# Patient Record
Sex: Female | Born: 1960 | Race: White | Hispanic: No | Marital: Single | Smoking: Never smoker
Health system: Southern US, Community
[De-identification: ages and names within clinical notes are randomized; demographics above are authoritative.]

## PROBLEM LIST (undated history)

## (undated) DIAGNOSIS — J45909 Unspecified asthma, uncomplicated: Secondary | ICD-10-CM

## (undated) HISTORY — PX: OTHER SURGICAL HISTORY: SHX169

---

## 2011-10-09 DIAGNOSIS — H43399 Other vitreous opacities, unspecified eye: Secondary | ICD-10-CM | POA: Diagnosis not present

## 2011-10-09 DIAGNOSIS — H524 Presbyopia: Secondary | ICD-10-CM | POA: Diagnosis not present

## 2011-10-09 DIAGNOSIS — H3589 Other specified retinal disorders: Secondary | ICD-10-CM | POA: Diagnosis not present

## 2011-10-09 DIAGNOSIS — H35039 Hypertensive retinopathy, unspecified eye: Secondary | ICD-10-CM | POA: Diagnosis not present

## 2011-11-01 DIAGNOSIS — R05 Cough: Secondary | ICD-10-CM | POA: Diagnosis not present

## 2011-11-01 DIAGNOSIS — J45901 Unspecified asthma with (acute) exacerbation: Secondary | ICD-10-CM | POA: Diagnosis not present

## 2012-05-12 DIAGNOSIS — J45902 Unspecified asthma with status asthmaticus: Secondary | ICD-10-CM | POA: Diagnosis not present

## 2012-06-21 DIAGNOSIS — Z23 Encounter for immunization: Secondary | ICD-10-CM | POA: Diagnosis not present

## 2012-10-12 DIAGNOSIS — H35039 Hypertensive retinopathy, unspecified eye: Secondary | ICD-10-CM | POA: Diagnosis not present

## 2012-10-12 DIAGNOSIS — H338 Other retinal detachments: Secondary | ICD-10-CM | POA: Diagnosis not present

## 2012-10-12 DIAGNOSIS — H2510 Age-related nuclear cataract, unspecified eye: Secondary | ICD-10-CM | POA: Diagnosis not present

## 2012-10-12 DIAGNOSIS — H5200 Hypermetropia, unspecified eye: Secondary | ICD-10-CM | POA: Diagnosis not present

## 2013-02-21 DIAGNOSIS — L02619 Cutaneous abscess of unspecified foot: Secondary | ICD-10-CM | POA: Diagnosis not present

## 2013-02-21 DIAGNOSIS — M79609 Pain in unspecified limb: Secondary | ICD-10-CM | POA: Diagnosis not present

## 2013-02-28 DIAGNOSIS — M79609 Pain in unspecified limb: Secondary | ICD-10-CM | POA: Diagnosis not present

## 2013-02-28 DIAGNOSIS — L02619 Cutaneous abscess of unspecified foot: Secondary | ICD-10-CM | POA: Diagnosis not present

## 2013-03-03 DIAGNOSIS — L500 Allergic urticaria: Secondary | ICD-10-CM | POA: Diagnosis not present

## 2013-03-03 DIAGNOSIS — T50995A Adverse effect of other drugs, medicaments and biological substances, initial encounter: Secondary | ICD-10-CM | POA: Diagnosis not present

## 2013-07-21 DIAGNOSIS — J40 Bronchitis, not specified as acute or chronic: Secondary | ICD-10-CM | POA: Diagnosis not present

## 2014-02-03 DIAGNOSIS — J45901 Unspecified asthma with (acute) exacerbation: Secondary | ICD-10-CM | POA: Diagnosis not present

## 2014-06-22 DIAGNOSIS — J4522 Mild intermittent asthma with status asthmaticus: Secondary | ICD-10-CM | POA: Diagnosis not present

## 2014-07-07 DIAGNOSIS — Z23 Encounter for immunization: Secondary | ICD-10-CM | POA: Diagnosis not present

## 2014-12-14 DIAGNOSIS — E785 Hyperlipidemia, unspecified: Secondary | ICD-10-CM | POA: Diagnosis not present

## 2014-12-14 DIAGNOSIS — J4522 Mild intermittent asthma with status asthmaticus: Secondary | ICD-10-CM | POA: Diagnosis not present

## 2014-12-14 DIAGNOSIS — R5383 Other fatigue: Secondary | ICD-10-CM | POA: Diagnosis not present

## 2014-12-21 DIAGNOSIS — Z0001 Encounter for general adult medical examination with abnormal findings: Secondary | ICD-10-CM | POA: Diagnosis not present

## 2014-12-21 DIAGNOSIS — J4522 Mild intermittent asthma with status asthmaticus: Secondary | ICD-10-CM | POA: Diagnosis not present

## 2014-12-21 DIAGNOSIS — F819 Developmental disorder of scholastic skills, unspecified: Secondary | ICD-10-CM | POA: Diagnosis not present

## 2015-04-20 DIAGNOSIS — R1031 Right lower quadrant pain: Secondary | ICD-10-CM | POA: Diagnosis not present

## 2015-04-20 DIAGNOSIS — J45909 Unspecified asthma, uncomplicated: Secondary | ICD-10-CM | POA: Diagnosis not present

## 2015-04-20 DIAGNOSIS — E279 Disorder of adrenal gland, unspecified: Secondary | ICD-10-CM | POA: Diagnosis not present

## 2015-04-20 DIAGNOSIS — Z79899 Other long term (current) drug therapy: Secondary | ICD-10-CM | POA: Diagnosis not present

## 2015-04-20 DIAGNOSIS — R0602 Shortness of breath: Secondary | ICD-10-CM | POA: Diagnosis not present

## 2015-04-20 DIAGNOSIS — R1110 Vomiting, unspecified: Secondary | ICD-10-CM | POA: Diagnosis not present

## 2015-04-20 DIAGNOSIS — Z7951 Long term (current) use of inhaled steroids: Secondary | ICD-10-CM | POA: Diagnosis not present

## 2015-04-20 DIAGNOSIS — N2889 Other specified disorders of kidney and ureter: Secondary | ICD-10-CM | POA: Diagnosis not present

## 2015-04-20 DIAGNOSIS — N1330 Unspecified hydronephrosis: Secondary | ICD-10-CM | POA: Diagnosis not present

## 2015-04-20 DIAGNOSIS — R05 Cough: Secondary | ICD-10-CM | POA: Diagnosis not present

## 2015-04-20 DIAGNOSIS — N3289 Other specified disorders of bladder: Secondary | ICD-10-CM | POA: Diagnosis not present

## 2015-04-20 DIAGNOSIS — N281 Cyst of kidney, acquired: Secondary | ICD-10-CM | POA: Diagnosis not present

## 2015-04-25 DIAGNOSIS — R1909 Other intra-abdominal and pelvic swelling, mass and lump: Secondary | ICD-10-CM | POA: Diagnosis not present

## 2015-04-25 DIAGNOSIS — N289 Disorder of kidney and ureter, unspecified: Secondary | ICD-10-CM | POA: Diagnosis not present

## 2015-04-30 DIAGNOSIS — N323 Diverticulum of bladder: Secondary | ICD-10-CM | POA: Diagnosis not present

## 2015-04-30 DIAGNOSIS — E278 Other specified disorders of adrenal gland: Secondary | ICD-10-CM | POA: Diagnosis not present

## 2015-05-20 DIAGNOSIS — J45909 Unspecified asthma, uncomplicated: Secondary | ICD-10-CM | POA: Diagnosis not present

## 2015-05-20 DIAGNOSIS — R112 Nausea with vomiting, unspecified: Secondary | ICD-10-CM | POA: Diagnosis not present

## 2015-05-20 DIAGNOSIS — Z79899 Other long term (current) drug therapy: Secondary | ICD-10-CM | POA: Diagnosis not present

## 2015-05-20 DIAGNOSIS — Z7951 Long term (current) use of inhaled steroids: Secondary | ICD-10-CM | POA: Diagnosis not present

## 2015-05-20 DIAGNOSIS — R197 Diarrhea, unspecified: Secondary | ICD-10-CM | POA: Diagnosis not present

## 2015-05-20 DIAGNOSIS — F819 Developmental disorder of scholastic skills, unspecified: Secondary | ICD-10-CM | POA: Diagnosis not present

## 2015-05-22 DIAGNOSIS — K529 Noninfective gastroenteritis and colitis, unspecified: Secondary | ICD-10-CM | POA: Diagnosis not present

## 2015-05-22 DIAGNOSIS — Q619 Cystic kidney disease, unspecified: Secondary | ICD-10-CM | POA: Diagnosis not present

## 2015-05-29 DIAGNOSIS — H52223 Regular astigmatism, bilateral: Secondary | ICD-10-CM | POA: Diagnosis not present

## 2015-05-29 DIAGNOSIS — H2513 Age-related nuclear cataract, bilateral: Secondary | ICD-10-CM | POA: Diagnosis not present

## 2015-05-29 DIAGNOSIS — H5203 Hypermetropia, bilateral: Secondary | ICD-10-CM | POA: Diagnosis not present

## 2015-05-29 DIAGNOSIS — H59811 Chorioretinal scars after surgery for detachment, right eye: Secondary | ICD-10-CM | POA: Diagnosis not present

## 2015-06-21 DIAGNOSIS — R112 Nausea with vomiting, unspecified: Secondary | ICD-10-CM | POA: Diagnosis not present

## 2015-06-21 DIAGNOSIS — Q619 Cystic kidney disease, unspecified: Secondary | ICD-10-CM | POA: Diagnosis not present

## 2015-06-21 DIAGNOSIS — Z23 Encounter for immunization: Secondary | ICD-10-CM | POA: Diagnosis not present

## 2015-07-17 DIAGNOSIS — F819 Developmental disorder of scholastic skills, unspecified: Secondary | ICD-10-CM | POA: Diagnosis not present

## 2015-07-17 DIAGNOSIS — Q619 Cystic kidney disease, unspecified: Secondary | ICD-10-CM | POA: Diagnosis not present

## 2015-07-17 DIAGNOSIS — J4522 Mild intermittent asthma with status asthmaticus: Secondary | ICD-10-CM | POA: Diagnosis not present

## 2015-09-21 DIAGNOSIS — M79671 Pain in right foot: Secondary | ICD-10-CM | POA: Diagnosis not present

## 2015-10-22 DIAGNOSIS — D7389 Other diseases of spleen: Secondary | ICD-10-CM | POA: Diagnosis not present

## 2015-10-22 DIAGNOSIS — E278 Other specified disorders of adrenal gland: Secondary | ICD-10-CM | POA: Diagnosis not present

## 2015-10-29 DIAGNOSIS — R1900 Intra-abdominal and pelvic swelling, mass and lump, unspecified site: Secondary | ICD-10-CM | POA: Diagnosis not present

## 2015-10-29 DIAGNOSIS — E278 Other specified disorders of adrenal gland: Secondary | ICD-10-CM | POA: Diagnosis not present

## 2015-11-27 DIAGNOSIS — R1900 Intra-abdominal and pelvic swelling, mass and lump, unspecified site: Secondary | ICD-10-CM | POA: Diagnosis not present

## 2015-12-04 DIAGNOSIS — N7011 Chronic salpingitis: Secondary | ICD-10-CM | POA: Diagnosis not present

## 2015-12-04 DIAGNOSIS — R1900 Intra-abdominal and pelvic swelling, mass and lump, unspecified site: Secondary | ICD-10-CM | POA: Diagnosis not present

## 2015-12-04 DIAGNOSIS — N83201 Unspecified ovarian cyst, right side: Secondary | ICD-10-CM | POA: Diagnosis not present

## 2015-12-29 DIAGNOSIS — J4522 Mild intermittent asthma with status asthmaticus: Secondary | ICD-10-CM | POA: Diagnosis not present

## 2015-12-29 DIAGNOSIS — R0602 Shortness of breath: Secondary | ICD-10-CM | POA: Diagnosis not present

## 2015-12-29 DIAGNOSIS — R05 Cough: Secondary | ICD-10-CM | POA: Diagnosis not present

## 2016-01-08 DIAGNOSIS — Z7951 Long term (current) use of inhaled steroids: Secondary | ICD-10-CM | POA: Diagnosis not present

## 2016-01-08 DIAGNOSIS — Z801 Family history of malignant neoplasm of trachea, bronchus and lung: Secondary | ICD-10-CM | POA: Diagnosis not present

## 2016-01-08 DIAGNOSIS — R1909 Other intra-abdominal and pelvic swelling, mass and lump: Secondary | ICD-10-CM | POA: Diagnosis not present

## 2016-01-08 DIAGNOSIS — Z8249 Family history of ischemic heart disease and other diseases of the circulatory system: Secondary | ICD-10-CM | POA: Diagnosis not present

## 2016-01-08 DIAGNOSIS — J45909 Unspecified asthma, uncomplicated: Secondary | ICD-10-CM | POA: Diagnosis not present

## 2016-01-08 DIAGNOSIS — N83511 Torsion of right ovary and ovarian pedicle: Secondary | ICD-10-CM | POA: Diagnosis not present

## 2016-01-08 DIAGNOSIS — N83201 Unspecified ovarian cyst, right side: Secondary | ICD-10-CM | POA: Diagnosis not present

## 2016-01-08 DIAGNOSIS — F70 Mild intellectual disabilities: Secondary | ICD-10-CM | POA: Diagnosis not present

## 2016-01-08 DIAGNOSIS — Z812 Family history of tobacco abuse and dependence: Secondary | ICD-10-CM | POA: Diagnosis not present

## 2016-01-08 DIAGNOSIS — Z818 Family history of other mental and behavioral disorders: Secondary | ICD-10-CM | POA: Diagnosis not present

## 2016-01-08 DIAGNOSIS — Z79899 Other long term (current) drug therapy: Secondary | ICD-10-CM | POA: Diagnosis not present

## 2016-01-09 DIAGNOSIS — Z7951 Long term (current) use of inhaled steroids: Secondary | ICD-10-CM | POA: Diagnosis not present

## 2016-01-09 DIAGNOSIS — Z79899 Other long term (current) drug therapy: Secondary | ICD-10-CM | POA: Diagnosis not present

## 2016-01-09 DIAGNOSIS — F70 Mild intellectual disabilities: Secondary | ICD-10-CM | POA: Diagnosis not present

## 2016-01-09 DIAGNOSIS — N838 Other noninflammatory disorders of ovary, fallopian tube and broad ligament: Secondary | ICD-10-CM | POA: Diagnosis not present

## 2016-01-09 DIAGNOSIS — R1909 Other intra-abdominal and pelvic swelling, mass and lump: Secondary | ICD-10-CM | POA: Diagnosis not present

## 2016-01-09 DIAGNOSIS — J45909 Unspecified asthma, uncomplicated: Secondary | ICD-10-CM | POA: Diagnosis not present

## 2016-01-09 DIAGNOSIS — Z8249 Family history of ischemic heart disease and other diseases of the circulatory system: Secondary | ICD-10-CM | POA: Diagnosis not present

## 2016-01-09 DIAGNOSIS — F419 Anxiety disorder, unspecified: Secondary | ICD-10-CM | POA: Diagnosis not present

## 2016-01-09 DIAGNOSIS — Z818 Family history of other mental and behavioral disorders: Secondary | ICD-10-CM | POA: Diagnosis not present

## 2016-01-09 DIAGNOSIS — Z812 Family history of tobacco abuse and dependence: Secondary | ICD-10-CM | POA: Diagnosis not present

## 2016-01-09 DIAGNOSIS — N83201 Unspecified ovarian cyst, right side: Secondary | ICD-10-CM | POA: Diagnosis not present

## 2016-01-09 DIAGNOSIS — Z801 Family history of malignant neoplasm of trachea, bronchus and lung: Secondary | ICD-10-CM | POA: Diagnosis not present

## 2016-01-09 DIAGNOSIS — N83511 Torsion of right ovary and ovarian pedicle: Secondary | ICD-10-CM | POA: Diagnosis not present

## 2016-03-26 DIAGNOSIS — M7551 Bursitis of right shoulder: Secondary | ICD-10-CM | POA: Diagnosis not present

## 2016-03-26 DIAGNOSIS — Z6825 Body mass index (BMI) 25.0-25.9, adult: Secondary | ICD-10-CM | POA: Diagnosis not present

## 2016-04-17 DIAGNOSIS — Z6824 Body mass index (BMI) 24.0-24.9, adult: Secondary | ICD-10-CM | POA: Diagnosis not present

## 2016-04-17 DIAGNOSIS — M25511 Pain in right shoulder: Secondary | ICD-10-CM | POA: Diagnosis not present

## 2016-04-17 DIAGNOSIS — M7551 Bursitis of right shoulder: Secondary | ICD-10-CM | POA: Diagnosis not present

## 2016-07-07 DIAGNOSIS — Z6824 Body mass index (BMI) 24.0-24.9, adult: Secondary | ICD-10-CM | POA: Diagnosis not present

## 2016-07-07 DIAGNOSIS — R05 Cough: Secondary | ICD-10-CM | POA: Diagnosis not present

## 2016-07-07 DIAGNOSIS — K529 Noninfective gastroenteritis and colitis, unspecified: Secondary | ICD-10-CM | POA: Diagnosis not present

## 2016-10-03 DIAGNOSIS — R1030 Lower abdominal pain, unspecified: Secondary | ICD-10-CM | POA: Diagnosis not present

## 2016-10-03 DIAGNOSIS — K529 Noninfective gastroenteritis and colitis, unspecified: Secondary | ICD-10-CM | POA: Diagnosis not present

## 2016-10-03 DIAGNOSIS — R112 Nausea with vomiting, unspecified: Secondary | ICD-10-CM | POA: Diagnosis not present

## 2016-10-03 DIAGNOSIS — Z79899 Other long term (current) drug therapy: Secondary | ICD-10-CM | POA: Diagnosis not present

## 2016-10-03 DIAGNOSIS — J45909 Unspecified asthma, uncomplicated: Secondary | ICD-10-CM | POA: Diagnosis not present

## 2016-10-06 DIAGNOSIS — R1013 Epigastric pain: Secondary | ICD-10-CM | POA: Diagnosis not present

## 2016-10-06 DIAGNOSIS — Z6824 Body mass index (BMI) 24.0-24.9, adult: Secondary | ICD-10-CM | POA: Diagnosis not present

## 2016-10-06 DIAGNOSIS — R112 Nausea with vomiting, unspecified: Secondary | ICD-10-CM | POA: Diagnosis not present

## 2016-10-07 ENCOUNTER — Emergency Department (HOSPITAL_COMMUNITY): Payer: Medicare Other

## 2016-10-07 ENCOUNTER — Emergency Department (HOSPITAL_COMMUNITY)
Admission: EM | Admit: 2016-10-07 | Discharge: 2016-10-08 | Disposition: A | Discharge status: Home / Self Care | Payer: Medicare Other | Attending: Emergency Medicine | Admitting: Emergency Medicine

## 2016-10-07 ENCOUNTER — Encounter (HOSPITAL_COMMUNITY): Payer: Self-pay

## 2016-10-07 DIAGNOSIS — J45909 Unspecified asthma, uncomplicated: Secondary | ICD-10-CM | POA: Insufficient documentation

## 2016-10-07 DIAGNOSIS — R112 Nausea with vomiting, unspecified: Secondary | ICD-10-CM | POA: Diagnosis not present

## 2016-10-07 DIAGNOSIS — Z79899 Other long term (current) drug therapy: Secondary | ICD-10-CM | POA: Insufficient documentation

## 2016-10-07 HISTORY — DX: Unspecified asthma, uncomplicated: J45.909

## 2016-10-07 LAB — URINALYSIS, ROUTINE W REFLEX MICROSCOPIC
Bilirubin Urine: NEGATIVE
Glucose, UA: NEGATIVE mg/dL
Hgb urine dipstick: NEGATIVE
KETONES UR: 5 mg/dL — AB
Nitrite: NEGATIVE
PROTEIN: NEGATIVE mg/dL
Specific Gravity, Urine: 1.011 (ref 1.005–1.030)
WBC, UA: NONE SEEN WBC/hpf (ref 0–5)
pH: 7 (ref 5.0–8.0)

## 2016-10-07 LAB — COMPREHENSIVE METABOLIC PANEL
ALT: 10 U/L — ABNORMAL LOW (ref 14–54)
AST: 14 U/L — AB (ref 15–41)
Albumin: 4.3 g/dL (ref 3.5–5.0)
Alkaline Phosphatase: 49 U/L (ref 38–126)
Anion gap: 8 (ref 5–15)
BUN: 12 mg/dL (ref 6–20)
CHLORIDE: 106 mmol/L (ref 101–111)
CO2: 27 mmol/L (ref 22–32)
Calcium: 9.4 mg/dL (ref 8.9–10.3)
Creatinine, Ser: 0.95 mg/dL (ref 0.44–1.00)
GFR calc Af Amer: >60 mL/min (ref >60)
Glucose, Bld: 83 mg/dL (ref 65–99)
POTASSIUM: 3.4 mmol/L — AB (ref 3.5–5.1)
SODIUM: 141 mmol/L (ref 135–145)
Total Bilirubin: 1 mg/dL (ref 0.3–1.2)
Total Protein: 6.7 g/dL (ref 6.5–8.1)

## 2016-10-07 LAB — CBC
HEMATOCRIT: 43.1 % (ref 36.0–46.0)
Hemoglobin: 14.6 g/dL (ref 12.0–15.0)
MCH: 31 pg (ref 26.0–34.0)
MCHC: 33.9 g/dL (ref 30.0–36.0)
MCV: 91.5 fL (ref 78.0–100.0)
Platelets: 200 10*3/uL (ref 150–400)
RBC: 4.71 MIL/uL (ref 3.87–5.11)
RDW: 13.2 % (ref 11.5–15.5)
WBC: 5.8 10*3/uL (ref 4.0–10.5)

## 2016-10-07 LAB — LIPASE, BLOOD: LIPASE: 21 U/L (ref 11–51)

## 2016-10-07 NOTE — ED Provider Notes (Signed)
[facility] DEPT Provider Note   CSN: [id number] Arrival date & time: 10/07/16  1834   By signing my name below, I, Cynthia Schwartz, attest that this documentation has been prepared under the direction and in the presence of Cynthia Porter, MD. Electronically Signed: Hilbert Schwartz, Scribe. 10/08/16. 12:14 AM.  Time seen 23:26 PM    History   Chief Complaint Chief Complaint  Patient presents with  . Nausea  . Emesis     The history is provided by the patient and a relative. No language interpreter was used.   HPI Comments: Cynthia Schwartz is a 56 y.o. female who presents to the Emergency Department complaining of worsening episodes of emesis with associated nausea for the past few months. Per family: They state that they patient doesn't vomiting every day. They states that she may vomit roughly 2 days a week. They state that the patient became worse today. She woke up this morning and began vomiting before drinking or eating anything. Her asthma began to act up around 6pm today. She was given a nebulizer with some improvement. She was seen at [facility] ED for the same last week and was put on nausea meds. She was seen yesterday by her PCP and she was started on  Nexium at that time which has provided no significant relief after only taking it one day. Her PCP is also going to refer her to a gastroenterologist. Nothing makes the nausea  worse or better. She has epigastric abdominal pain off and on. She denies weight loss. She lives with her mother. Denise hx of tobacco abuse and alcohol abuse. She is compliant with all of her medications. She states that she is feeling better currently. She denies any nausea currently.  Patient was seen at [facility] and had some type of stable growth on her kidney that they are monitoring. She also had an ovary removed last summer.  PCP [facility]   Past Medical History:  Diagnosis Date  . Asthma     There are no  active problems to display for this patient.   Past Surgical History:  Procedure Laterality Date  . ovarian cyst removed    . spot on kidney      OB History    No data available       Home Medications    Prior to Admission medications   Medication Sig Start Date End Date Taking? Authorizing Provider  albuterol (PROVENTIL) (2.5 MG/3ML) 0.083% nebulizer solution Take 2.5 mg by nebulization every 6 (six) hours as needed for wheezing or shortness of breath.   Yes Historical Provider, MD  esomeprazole (NEXIUM) 20 MG capsule Take 20 mg by mouth daily at 12 noon.   Yes Historical Provider, MD  Fluticasone-Salmeterol (ADVAIR) 100-50 MCG/DOSE AEPB Inhale 1 puff into the lungs 2 (two) times daily.   Yes Historical Provider, MD  hyoscyamine (LEVSIN SL) 0.125 MG SL tablet Place 0.125 mg under the tongue 5 (five) times daily as needed for cramping.   Yes Historical Provider, MD  ondansetron (ZOFRAN) 4 MG tablet Take 1 tablet (4 mg total) by mouth every 8 (eight) hours as needed. 10/08/16   Cynthia Porter, MD    Family History No family history on file.  Social History Social History  Substance Use Topics  . Smoking status: Never Smoker  . Smokeless tobacco: Never Used  . Alcohol use No  lives at home Lives with mother Patient is on disability for being mental slow  Allergies   Patient has no known allergies.   Review of Systems Review of Systems  Constitutional: Negative for fever.  Respiratory: Negative for cough.   Cardiovascular: Negative for chest pain.  Gastrointestinal: Positive for nausea and vomiting.  All other systems reviewed and are negative.    Physical Exam Updated Vital Signs BP 149/76 (BP Location: Left Arm)   Pulse (!) 55   Temp 98.4 F (36.9 C) (Oral)   Resp 16   Ht 5\' 5"  (1.651 m)   Wt 144 lb (65.3 kg)   SpO2 100%   BMI 23.96 kg/m   Vital signs normal except for bradycardia    Physical Exam  Constitutional: She is oriented to person, place, and  time. She appears well-developed and well-nourished.  Non-toxic appearance. She does not appear ill. No distress.  HENT:  Head: Normocephalic and atraumatic.  Right Ear: External ear normal.  Left Ear: External ear normal.  Nose: Nose normal. No mucosal edema or rhinorrhea.  Mouth/Throat: Oropharynx is clear and moist and mucous membranes are normal. No dental abscesses or uvula swelling.  Eyes: Conjunctivae and EOM are normal. Pupils are equal, round, and reactive to light.  Neck: Normal range of motion and full passive range of motion without pain. Neck supple.  Cardiovascular: Regular rhythm and normal heart sounds.  Bradycardia present.  Exam reveals no gallop and no friction rub.   No murmur heard. Pulmonary/Chest: Effort normal and breath sounds normal. No respiratory distress. She has no wheezes. She has no rhonchi. She has no rales. She exhibits no tenderness and no crepitus.  Abdominal: Soft. Normal appearance and bowel sounds are normal. She exhibits no distension. There is no tenderness. There is no rebound and no guarding.  Musculoskeletal: Normal range of motion. She exhibits no edema or tenderness.  Moves all extremities well.   Neurological: She is alert and oriented to person, place, and time. She has normal strength. No cranial nerve deficit.  Seems mentally slow  Skin: Skin is warm, dry and intact. No rash noted. No erythema. No pallor.  Psychiatric: She has a normal mood and affect. Her speech is normal and behavior is normal. Her mood appears not anxious.  Nursing note and vitals reviewed.    ED Treatments / Results  Labs (all labs ordered are listed, but only abnormal results are displayed) Results for orders placed or performed during the hospital encounter of 10/07/16  Lipase, blood  Result Value Ref Range   Lipase 21 11 - 51 U/L  Comprehensive metabolic panel  Result Value Ref Range   Sodium 141 135 - 145 mmol/L   Potassium 3.4 (L) 3.5 - 5.1 mmol/L   Chloride  106 101 - 111 mmol/L   CO2 27 22 - 32 mmol/L   Glucose, Bld 83 65 - 99 mg/dL   BUN 12 6 - 20 mg/dL   Creatinine, Ser 0.95 0.44 - 1.00 mg/dL   Calcium 9.4 8.9 - 10.3 mg/dL   Total Protein 6.7 6.5 - 8.1 g/dL   Albumin 4.3 3.5 - 5.0 g/dL   AST 14 (L) 15 - 41 U/L   ALT 10 (L) 14 - 54 U/L   Alkaline Phosphatase 49 38 - 126 U/L   Total Bilirubin 1.0 0.3 - 1.2 mg/dL   GFR calc non Af Amer >60 >60 mL/min   GFR calc Af Amer >60 >60 mL/min   Anion gap 8 5 - 15  CBC  Result Value Ref Range   WBC 5.8 4.0 -  10.5 K/uL   RBC 4.71 3.87 - 5.11 MIL/uL   Hemoglobin 14.6 12.0 - 15.0 g/dL   HCT 43.1 36.0 - 46.0 %   MCV 91.5 78.0 - 100.0 fL   MCH 31.0 26.0 - 34.0 pg   MCHC 33.9 30.0 - 36.0 g/dL   RDW 13.2 11.5 - 15.5 %   Platelets 200 150 - 400 K/uL  Urinalysis, Routine w reflex microscopic  Result Value Ref Range   Color, Urine YELLOW YELLOW   APPearance CLEAR CLEAR   Specific Gravity, Urine 1.011 1.005 - 1.030   pH 7.0 5.0 - 8.0   Glucose, UA NEGATIVE NEGATIVE mg/dL   Hgb urine dipstick NEGATIVE NEGATIVE   Bilirubin Urine NEGATIVE NEGATIVE   Ketones, ur 5 (A) NEGATIVE mg/dL   Protein, ur NEGATIVE NEGATIVE mg/dL   Nitrite NEGATIVE NEGATIVE   Leukocytes, UA TRACE (A) NEGATIVE   RBC / HPF 0-5 0 - 5 RBC/hpf   WBC, UA NONE SEEN 0 - 5 WBC/hpf   Bacteria, UA RARE (A) NONE SEEN   Mucous PRESENT    Laboratory interpretation all normal except mild hypokalemia       Radiology Dg Chest 2 View  Result Date: 10/07/2016 CLINICAL DATA:  Difficulty breathing today, nausea, vomiting, history asthma EXAM: CHEST  2 VIEW COMPARISON:  None FINDINGS: Borderline enlargement of cardiac silhouette. Mediastinal contours and pulmonary vascularity normal. Calcified granuloma LEFT mid lung. Accentuated RIGHT middle lobe markings and poor definition of RIGHT heart border likely related to pectus deformity. No definite acute infiltrate, pleural effusion or pneumothorax. Bones demineralized. IMPRESSION: No acute  abnormalities. Electronically Signed   By: Lavonia Dana M.D.   On: 10/07/2016 19:18    Procedures Procedures (including critical care time)  Medications Ordered in ED Medications - No data to display   Initial Impression / Assessment and Plan / ED Course  DIAGNOSTIC STUDIES: Oxygen Saturation is 100% on RA, normal by my interpretation.    COORDINATION OF CARE: 11:32 PM Discussed the results of her CXR and labs, pt is agreeable to try to drink fluids.   PT was able to drink without difficulty. They were advised to f/u with her PCP about the GI referral and to stay on the nexium.   I have reviewed the triage vital signs and the nursing notes.  Pertinent labs & imaging results that were available during my care of the patient were reviewed by me and considered in my medical decision making (see chart for details).       Final Clinical Impressions(s) / ED Diagnoses   Final diagnoses:  Non-intractable vomiting with nausea, unspecified vomiting type    New Prescriptions New Prescriptions   ONDANSETRON (ZOFRAN) 4 MG TABLET    Take 1 tablet (4 mg total) by mouth every 8 (eight) hours as needed.    Plan discharge  I personally performed the services described in this documentation, which was scribed in my presence. The recorded information has been reviewed and considered.  Cynthia Porter, MD, Barbette Or, MD 10/08/16 [phone]

## 2016-10-07 NOTE — ED Triage Notes (Addendum)
Family reports that pt has a problem with N/V for a "long time". Family took her to [facility] on Friday and gave her new nausea medication(levsin) and ruled out her gall bladder. Pt went to primary care yesterday and was given zofran and nexium Also note that she had a hard time breathing today. No cough

## 2016-10-07 NOTE — ED Notes (Signed)
Pt and family made aware of the wait time. Pt and family had no complaints at this time. 

## 2016-10-08 MED ORDER — ONDANSETRON HCL 4 MG PO TABS: 4.0000 mg | ORAL_TABLET | Freq: Three times a day (TID) | ORAL | 0 refills | Status: DC | PRN

## 2016-10-08 NOTE — Discharge Instructions (Signed)
Drink plenty of fluids. Use the zofran for nausea or vomiting. Follow up with your primary care doctor to get her referral to the gastroenterologist as you have already discussed with them. Continue the nexium.

## 2016-10-10 DIAGNOSIS — N281 Cyst of kidney, acquired: Secondary | ICD-10-CM | POA: Diagnosis not present

## 2016-10-10 DIAGNOSIS — E278 Other specified disorders of adrenal gland: Secondary | ICD-10-CM | POA: Diagnosis not present

## 2016-10-14 DIAGNOSIS — N2889 Other specified disorders of kidney and ureter: Secondary | ICD-10-CM | POA: Diagnosis not present

## 2016-10-14 DIAGNOSIS — E278 Other specified disorders of adrenal gland: Secondary | ICD-10-CM | POA: Diagnosis not present

## 2016-10-15 ENCOUNTER — Encounter (INDEPENDENT_AMBULATORY_CARE_PROVIDER_SITE_OTHER): Payer: Self-pay | Admitting: Internal Medicine

## 2016-10-22 DIAGNOSIS — R1013 Epigastric pain: Secondary | ICD-10-CM | POA: Diagnosis not present

## 2016-10-22 DIAGNOSIS — Q619 Cystic kidney disease, unspecified: Secondary | ICD-10-CM | POA: Diagnosis not present

## 2016-10-22 DIAGNOSIS — K529 Noninfective gastroenteritis and colitis, unspecified: Secondary | ICD-10-CM | POA: Diagnosis not present

## 2016-10-22 DIAGNOSIS — R112 Nausea with vomiting, unspecified: Secondary | ICD-10-CM | POA: Diagnosis not present

## 2016-10-27 ENCOUNTER — Encounter (INDEPENDENT_AMBULATORY_CARE_PROVIDER_SITE_OTHER): Payer: Self-pay | Admitting: Internal Medicine

## 2016-10-27 ENCOUNTER — Ambulatory Visit (INDEPENDENT_AMBULATORY_CARE_PROVIDER_SITE_OTHER): Payer: Medicare Other | Admitting: Internal Medicine

## 2016-10-27 VITALS — BP 124/70 | HR 64 | Temp 97.4°F | Ht 65.0 in | Wt 150.2 lb

## 2016-10-27 DIAGNOSIS — R112 Nausea with vomiting, unspecified: Secondary | ICD-10-CM | POA: Diagnosis not present

## 2016-10-27 NOTE — Progress Notes (Signed)
   Subjective:    Patient ID: Cynthia Schwartz, female    DOB: Dec 30, 1960, 56 y.o.   MRN: [id number]  HPI Referred by Dr. Nadara Mustard for abdominal pain Nausea and vomiting. Symptoms x 2 months. She states she has been having nausea.  The nausea occurs about once a week.  When it occurs, she will not eat for 2 days. Since last Thursday, she has eaten "like crazy". Today she feels wonderful. No nausea or vomiting. Her appetite is good for the most part. There has been no weight loss.  She has a BM about once a day. Scheduled for an MRI at [facility] for possible kidney lesion.   Seen at [facility] in January for same and was advised she had a stomach bug.   10/03/2016 total bili 0.9, AST 10.9, ALT 9, ALP 56, Lipase 22.5, Albumin 4.23, H and H 14.2 and 42.5.  Review of Systems Past Medical History:  Diagnosis Date  . Asthma     Past Surgical History:  Procedure Laterality Date  . ovarian cyst removed    . spot on kidney      No Known Allergies  Current Outpatient Prescriptions on File Prior to Visit  Medication Sig Dispense Refill  . albuterol (PROVENTIL) (2.5 MG/3ML) 0.083% nebulizer solution Take 2.5 mg by nebulization every 6 (six) hours as needed for wheezing or shortness of breath.    . esomeprazole (NEXIUM) 20 MG capsule Take 20 mg by mouth daily at 12 noon.    . Fluticasone-Salmeterol (ADVAIR) 100-50 MCG/DOSE AEPB Inhale 1 puff into the lungs 2 (two) times daily.    . hyoscyamine (LEVSIN SL) 0.125 MG SL tablet Place 0.125 mg under the tongue 5 (five) times daily as needed for cramping.    . ondansetron (ZOFRAN) 4 MG tablet Take 1 tablet (4 mg total) by mouth every 8 (eight) hours as needed. 20 tablet 0   No current facility-administered medications on file prior to visit.        Objective:   Physical Exam Blood pressure 124/70, pulse 64, temperature 97.4 F (36.3 C), height 5\' 5"  (1.651 m), weight 150 lb 3.2 oz (68.1 kg). Alert and oriented. Skin warm and dry. Oral mucosa is moist.   .  Sclera anicteric, conjunctivae is pink. Thyroid not enlarged. No cervical lymphadenopathy. Lungs clear. Heart regular rate and rhythm.  Abdomen is soft. Bowel sounds are positive. No hepatomegaly. No abdominal masses felt. No tenderness.  No edema to lower extremities.          Assessment & Plan:  Sporadic nausea. Am going to get [place] report from [facility]. HIDA scan.  Samples of Nexium OTC 20mg  given to patient x 5 days.

## 2016-10-27 NOTE — Patient Instructions (Signed)
HIDA scan.

## 2016-10-31 ENCOUNTER — Encounter (HOSPITAL_COMMUNITY): Payer: Self-pay

## 2016-10-31 ENCOUNTER — Encounter (HOSPITAL_COMMUNITY)
Admission: RE | Admit: 2016-10-31 | Discharge: 2016-10-31 | Disposition: A | Discharge status: Home / Self Care | Payer: Medicare Other | Source: Ambulatory Visit | Attending: Internal Medicine | Admitting: Internal Medicine

## 2016-10-31 DIAGNOSIS — R1013 Epigastric pain: Secondary | ICD-10-CM | POA: Diagnosis not present

## 2016-10-31 DIAGNOSIS — R112 Nausea with vomiting, unspecified: Secondary | ICD-10-CM

## 2016-10-31 MED ORDER — TECHNETIUM TC 99M MEBROFENIN IV KIT
5.0000 | PACK | Freq: Once | INTRAVENOUS | Status: AC | PRN
  Administered 2016-10-31: 5 via INTRAVENOUS

## 2016-11-01 DIAGNOSIS — E278 Other specified disorders of adrenal gland: Secondary | ICD-10-CM | POA: Diagnosis not present

## 2016-11-01 DIAGNOSIS — N281 Cyst of kidney, acquired: Secondary | ICD-10-CM | POA: Diagnosis not present

## 2016-11-01 DIAGNOSIS — D7389 Other diseases of spleen: Secondary | ICD-10-CM | POA: Diagnosis not present

## 2016-11-04 ENCOUNTER — Encounter (INDEPENDENT_AMBULATORY_CARE_PROVIDER_SITE_OTHER): Payer: Self-pay

## 2016-11-06 ENCOUNTER — Encounter (INDEPENDENT_AMBULATORY_CARE_PROVIDER_SITE_OTHER): Payer: Self-pay

## 2016-11-06 ENCOUNTER — Telehealth (INDEPENDENT_AMBULATORY_CARE_PROVIDER_SITE_OTHER): Payer: Self-pay | Admitting: Internal Medicine

## 2016-11-06 NOTE — Telephone Encounter (Signed)
Patient's aunt, Cynthia Schwartz called to get patient's results.  [phone]

## 2016-11-07 ENCOUNTER — Encounter (INDEPENDENT_AMBULATORY_CARE_PROVIDER_SITE_OTHER): Payer: Self-pay | Admitting: Internal Medicine

## 2016-11-07 NOTE — Progress Notes (Signed)
Patient was given an appointment for 02/06/17 at 9:30am with Deberah Castle, NP.  A letter was mailed to the patient.

## 2016-11-10 NOTE — Telephone Encounter (Signed)
Results have been given to patient 

## 2016-12-09 DIAGNOSIS — G44219 Episodic tension-type headache, not intractable: Secondary | ICD-10-CM | POA: Diagnosis not present

## 2016-12-09 DIAGNOSIS — H40033 Anatomical narrow angle, bilateral: Secondary | ICD-10-CM | POA: Diagnosis not present

## 2016-12-19 DIAGNOSIS — R109 Unspecified abdominal pain: Secondary | ICD-10-CM | POA: Diagnosis not present

## 2016-12-19 DIAGNOSIS — J4522 Mild intermittent asthma with status asthmaticus: Secondary | ICD-10-CM | POA: Diagnosis not present

## 2016-12-19 DIAGNOSIS — R112 Nausea with vomiting, unspecified: Secondary | ICD-10-CM | POA: Diagnosis not present

## 2017-02-05 ENCOUNTER — Ambulatory Visit (INDEPENDENT_AMBULATORY_CARE_PROVIDER_SITE_OTHER): Payer: Medicaid Other | Admitting: Internal Medicine

## 2017-02-06 ENCOUNTER — Ambulatory Visit (INDEPENDENT_AMBULATORY_CARE_PROVIDER_SITE_OTHER): Payer: Medicaid Other | Admitting: Internal Medicine

## 2017-04-30 DIAGNOSIS — J4541 Moderate persistent asthma with (acute) exacerbation: Secondary | ICD-10-CM | POA: Diagnosis not present

## 2017-07-02 DIAGNOSIS — Z23 Encounter for immunization: Secondary | ICD-10-CM | POA: Diagnosis not present

## 2017-10-30 DIAGNOSIS — N323 Diverticulum of bladder: Secondary | ICD-10-CM | POA: Diagnosis not present

## 2017-10-30 DIAGNOSIS — E278 Other specified disorders of adrenal gland: Secondary | ICD-10-CM | POA: Diagnosis not present

## 2017-10-30 DIAGNOSIS — N281 Cyst of kidney, acquired: Secondary | ICD-10-CM | POA: Diagnosis not present

## 2017-11-30 DIAGNOSIS — J4541 Moderate persistent asthma with (acute) exacerbation: Secondary | ICD-10-CM | POA: Diagnosis not present

## 2017-12-23 IMAGING — DX DG CHEST 2V
2 series · 2 of 2 positions shown · non-contrast
Comparison: None

CLINICAL DATA: Difficulty breathing today, nausea, vomiting,
history asthma

EXAM:
CHEST  2 VIEW

[chest pa]
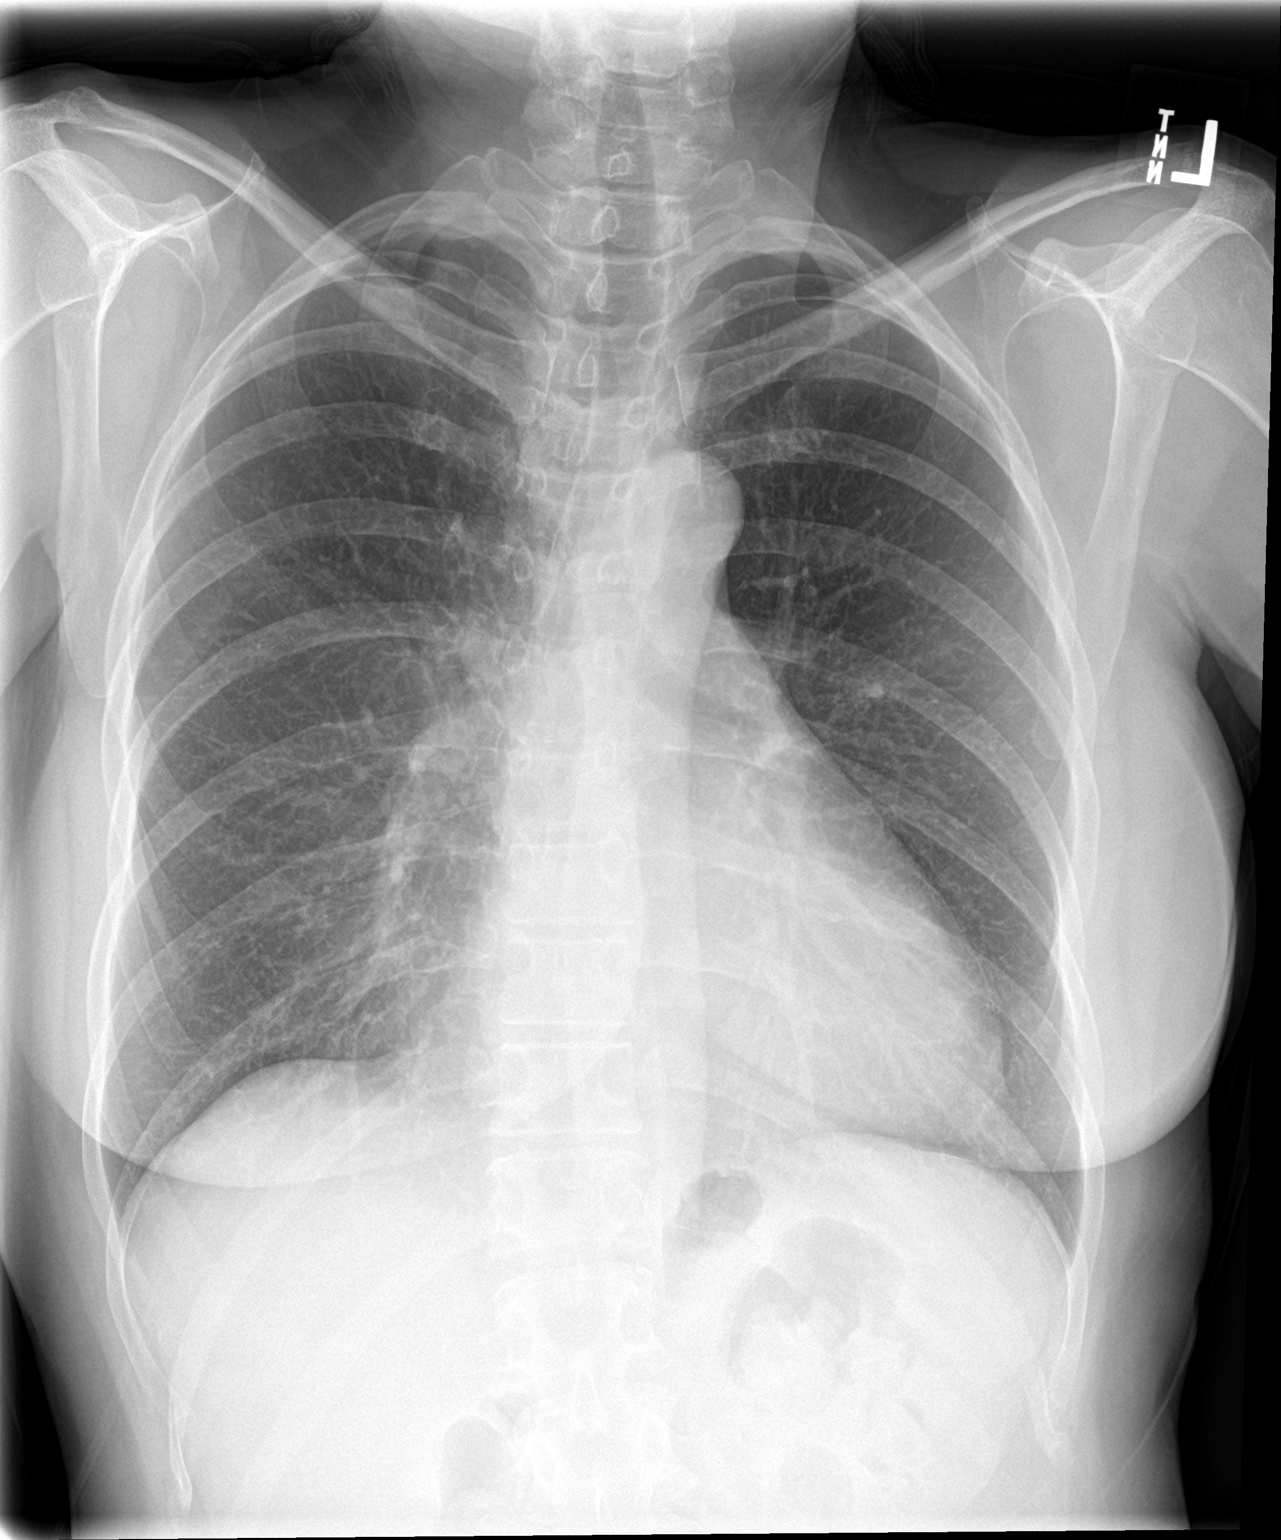

[chest lat]
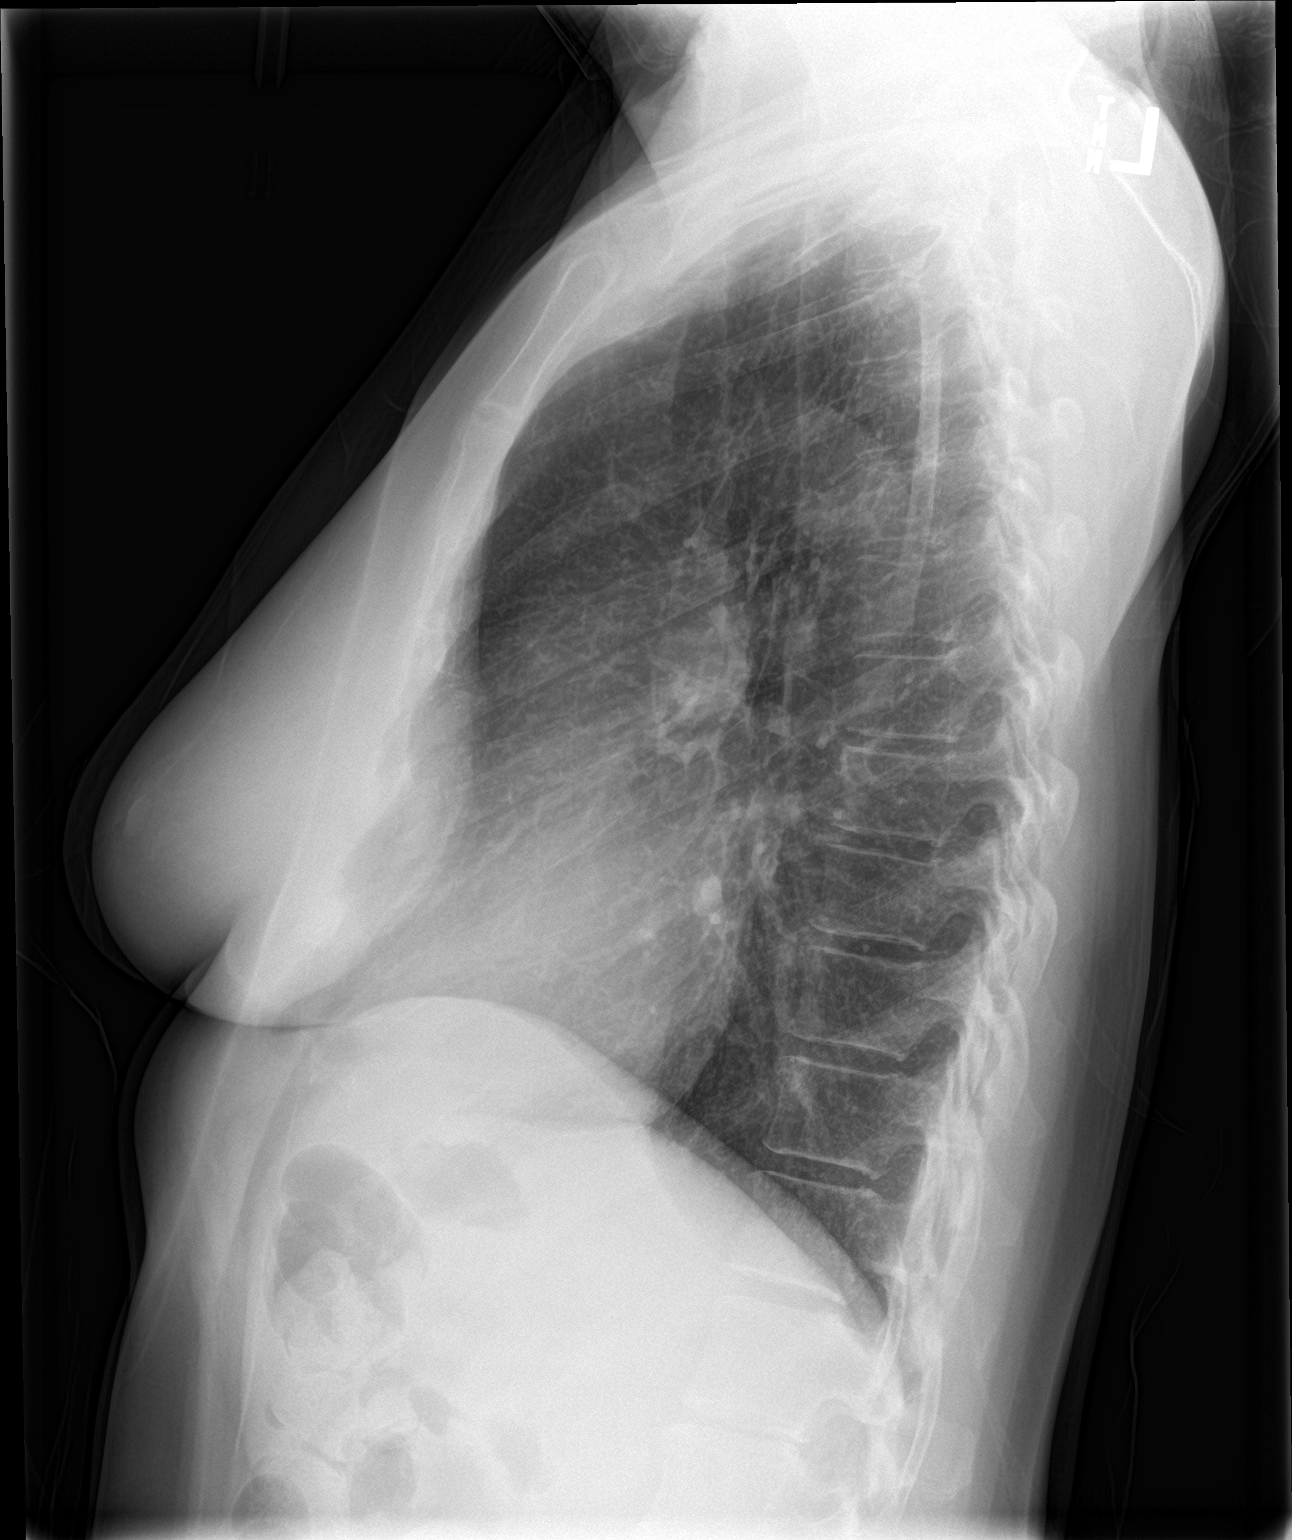

[2 of 2 positions shown; findings below may reference images not displayed]

FINDINGS: Borderline enlargement of cardiac silhouette.

Mediastinal contours and pulmonary vascularity normal.

Calcified granuloma LEFT mid lung.

Accentuated RIGHT middle lobe markings and poor definition of RIGHT
heart border likely related to pectus deformity.

No definite acute infiltrate, pleural effusion or pneumothorax.

Bones demineralized.
IMPRESSION: No acute abnormalities.

## 2018-01-16 IMAGING — NM NM HEPATO W/GB/PHARM/[PERSON_NAME]
2 series · 12 of 12 positions shown · non-contrast
Comparison: None

CLINICAL DATA: Chronic vomiting for the past year, some epigastric
pain

EXAM:
NUCLEAR MEDICINE HEPATOBILIARY IMAGING WITH GALLBLADDER EF
TECHNIQUE: Sequential images of the abdomen were obtained [DATE] minutes
following intravenous administration of radiopharmaceutical. After
oral ingestion of Ensure, gallbladder ejection fraction was
determined. At 60 min, normal ejection fraction is greater than 33%.
RADIOPHARMACEUTICALS:  5.6 mCi Ec-22m  Choletec IV

[Series 1: biliary · 3.25mm/px · 6 of 60 frames shown]
[frame 6/60]
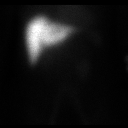
[frame 16/60]
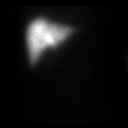
[frame 26/60]
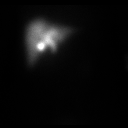
[frame 36/60]
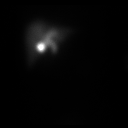
[frame 46/60]
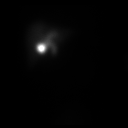
[frame 56/60]
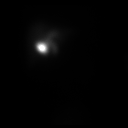

[Series 2: gbef · 3.25mm/px · 6 of 60 frames shown]
[frame 6/60]
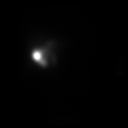
[frame 16/60]
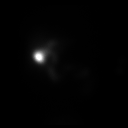
[frame 26/60]
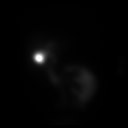
[frame 36/60]
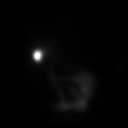
[frame 46/60]
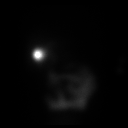
[frame 56/60]
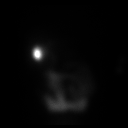

[12 of 12 positions shown; findings below may reference images not displayed]

FINDINGS: Normal tracer extraction from bloodstream indicating normal
hepatocellular function.

Normal excretion of tracer into biliary tree.

Gallbladder visualized at 23 min.

Small bowel not visualized until following fatty meal stimulation

No hepatic retention of tracer.

Subjectively normal emptying of tracer from gallbladder following
fatty meal stimulation.

Calculated gallbladder ejection fraction is 59% , normal.

Patient reported no symptoms following Ensure ingestion.

Normal gallbladder ejection fraction following Ensure ingestion is
greater than 33% at 1 hour.
IMPRESSION: Normal exam.

## 2018-04-30 DIAGNOSIS — E278 Other specified disorders of adrenal gland: Secondary | ICD-10-CM | POA: Diagnosis not present

## 2018-04-30 DIAGNOSIS — N323 Diverticulum of bladder: Secondary | ICD-10-CM | POA: Diagnosis not present

## 2018-04-30 DIAGNOSIS — N281 Cyst of kidney, acquired: Secondary | ICD-10-CM | POA: Diagnosis not present

## 2018-06-16 DIAGNOSIS — Z23 Encounter for immunization: Secondary | ICD-10-CM | POA: Diagnosis not present

## 2018-10-22 DIAGNOSIS — J45901 Unspecified asthma with (acute) exacerbation: Secondary | ICD-10-CM | POA: Diagnosis not present

## 2018-11-25 DIAGNOSIS — R05 Cough: Secondary | ICD-10-CM | POA: Diagnosis not present

## 2018-11-25 DIAGNOSIS — J45901 Unspecified asthma with (acute) exacerbation: Secondary | ICD-10-CM | POA: Diagnosis not present

## 2019-04-20 DIAGNOSIS — Z6822 Body mass index (BMI) 22.0-22.9, adult: Secondary | ICD-10-CM | POA: Diagnosis not present

## 2019-04-20 DIAGNOSIS — R112 Nausea with vomiting, unspecified: Secondary | ICD-10-CM | POA: Diagnosis not present

## 2019-04-20 DIAGNOSIS — F410 Panic disorder [episodic paroxysmal anxiety] without agoraphobia: Secondary | ICD-10-CM | POA: Diagnosis not present

## 2019-04-20 DIAGNOSIS — R634 Abnormal weight loss: Secondary | ICD-10-CM | POA: Diagnosis not present

## 2019-04-20 DIAGNOSIS — J45901 Unspecified asthma with (acute) exacerbation: Secondary | ICD-10-CM | POA: Diagnosis not present

## 2019-04-25 DIAGNOSIS — K529 Noninfective gastroenteritis and colitis, unspecified: Secondary | ICD-10-CM | POA: Diagnosis not present

## 2019-04-25 DIAGNOSIS — Z882 Allergy status to sulfonamides status: Secondary | ICD-10-CM | POA: Diagnosis not present

## 2019-04-25 DIAGNOSIS — N390 Urinary tract infection, site not specified: Secondary | ICD-10-CM | POA: Diagnosis not present

## 2019-04-25 DIAGNOSIS — R112 Nausea with vomiting, unspecified: Secondary | ICD-10-CM | POA: Diagnosis not present

## 2019-04-25 DIAGNOSIS — F419 Anxiety disorder, unspecified: Secondary | ICD-10-CM | POA: Diagnosis not present

## 2019-04-25 DIAGNOSIS — Z79899 Other long term (current) drug therapy: Secondary | ICD-10-CM | POA: Diagnosis not present

## 2019-04-25 DIAGNOSIS — J45909 Unspecified asthma, uncomplicated: Secondary | ICD-10-CM | POA: Diagnosis not present

## 2019-04-25 DIAGNOSIS — K219 Gastro-esophageal reflux disease without esophagitis: Secondary | ICD-10-CM | POA: Diagnosis not present

## 2019-04-25 DIAGNOSIS — Z7951 Long term (current) use of inhaled steroids: Secondary | ICD-10-CM | POA: Diagnosis not present

## 2019-05-02 DIAGNOSIS — R634 Abnormal weight loss: Secondary | ICD-10-CM | POA: Diagnosis not present

## 2019-05-02 DIAGNOSIS — F410 Panic disorder [episodic paroxysmal anxiety] without agoraphobia: Secondary | ICD-10-CM | POA: Diagnosis not present

## 2019-05-05 DIAGNOSIS — R300 Dysuria: Secondary | ICD-10-CM | POA: Diagnosis not present

## 2019-05-06 ENCOUNTER — Other Ambulatory Visit: Payer: Self-pay

## 2019-05-06 ENCOUNTER — Encounter (HOSPITAL_COMMUNITY): Payer: Self-pay | Admitting: *Deleted

## 2019-05-06 ENCOUNTER — Emergency Department (HOSPITAL_COMMUNITY)
Admission: EM | Admit: 2019-05-06 | Discharge: 2019-05-06 | Disposition: A | Discharge status: Home / Self Care | Payer: Medicare Other | Attending: Emergency Medicine | Admitting: Emergency Medicine

## 2019-05-06 DIAGNOSIS — E876 Hypokalemia: Secondary | ICD-10-CM | POA: Diagnosis not present

## 2019-05-06 DIAGNOSIS — Z79899 Other long term (current) drug therapy: Secondary | ICD-10-CM | POA: Diagnosis not present

## 2019-05-06 DIAGNOSIS — R112 Nausea with vomiting, unspecified: Secondary | ICD-10-CM | POA: Diagnosis not present

## 2019-05-06 DIAGNOSIS — R110 Nausea: Secondary | ICD-10-CM | POA: Diagnosis not present

## 2019-05-06 DIAGNOSIS — J45909 Unspecified asthma, uncomplicated: Secondary | ICD-10-CM | POA: Insufficient documentation

## 2019-05-06 LAB — URINALYSIS, ROUTINE W REFLEX MICROSCOPIC
Bilirubin Urine: NEGATIVE
Glucose, UA: NEGATIVE mg/dL
Hgb urine dipstick: NEGATIVE
Ketones, ur: 5 mg/dL — AB
Nitrite: NEGATIVE
Protein, ur: NEGATIVE mg/dL
Specific Gravity, Urine: 1.005 (ref 1.005–1.030)
pH: 7 (ref 5.0–8.0)

## 2019-05-06 LAB — COMPREHENSIVE METABOLIC PANEL
ALT: 12 U/L (ref 0–44)
AST: 15 U/L (ref 15–41)
Albumin: 4.6 g/dL (ref 3.5–5.0)
Alkaline Phosphatase: 56 U/L (ref 38–126)
Anion gap: 16 — ABNORMAL HIGH (ref 5–15)
BUN: 8 mg/dL (ref 6–20)
CO2: 18 mmol/L — ABNORMAL LOW (ref 22–32)
Calcium: 9.6 mg/dL (ref 8.9–10.3)
Chloride: 106 mmol/L (ref 98–111)
Creatinine, Ser: 0.95 mg/dL (ref 0.44–1.00)
GFR calc Af Amer: >60 mL/min (ref >60)
GFR calc non Af Amer: >60 mL/min (ref >60)
Glucose, Bld: 93 mg/dL (ref 70–99)
Potassium: 3 mmol/L — ABNORMAL LOW (ref 3.5–5.1)
Sodium: 140 mmol/L (ref 135–145)
Total Bilirubin: 1.1 mg/dL (ref 0.3–1.2)
Total Protein: 7.4 g/dL (ref 6.5–8.1)

## 2019-05-06 LAB — CBC
HCT: 46.4 % — ABNORMAL HIGH (ref 36.0–46.0)
Hemoglobin: 15.5 g/dL — ABNORMAL HIGH (ref 12.0–15.0)
MCH: 30.6 pg (ref 26.0–34.0)
MCHC: 33.4 g/dL (ref 30.0–36.0)
MCV: 91.5 fL (ref 80.0–100.0)
Platelets: 263 10*3/uL (ref 150–400)
RBC: 5.07 MIL/uL (ref 3.87–5.11)
RDW: 13.7 % (ref 11.5–15.5)
WBC: 6.1 10*3/uL (ref 4.0–10.5)
nRBC: 0 % (ref 0.0–0.2)

## 2019-05-06 LAB — LIPASE, BLOOD: Lipase: 25 U/L (ref 11–51)

## 2019-05-06 MED ORDER — POTASSIUM CHLORIDE CRYS ER 20 MEQ PO TBCR
40.0000 meq | EXTENDED_RELEASE_TABLET | Freq: Once | ORAL | Status: AC
  Administered 2019-05-06: 19:00:00 40 meq via ORAL
  Filled 2019-05-06: qty 2

## 2019-05-06 MED ORDER — PROMETHAZINE HCL 12.5 MG PO TABS: 12.5000 mg | ORAL_TABLET | Freq: Four times a day (QID) | ORAL | 0 refills | Status: DC | PRN

## 2019-05-06 MED ORDER — POTASSIUM CHLORIDE ER 10 MEQ PO TBCR: 10.0000 meq | EXTENDED_RELEASE_TABLET | Freq: Every day | ORAL | 0 refills | Status: DC

## 2019-05-06 MED ORDER — SODIUM CHLORIDE 0.9% FLUSH
3.0000 mL | Freq: Once | INTRAVENOUS | Status: AC
  Administered 2019-05-06: 21:00:00 3 mL via INTRAVENOUS

## 2019-05-06 MED ORDER — MAGNESIUM SULFATE 2 GM/50ML IV SOLN
2.0000 g | INTRAVENOUS | Status: AC
  Administered 2019-05-06: 2 g via INTRAVENOUS
  Filled 2019-05-06: qty 50

## 2019-05-06 MED ORDER — SODIUM CHLORIDE 0.9 % IV BOLUS
1000.0000 mL | Freq: Once | INTRAVENOUS | Status: AC
  Administered 2019-05-06: 19:00:00 1000 mL via INTRAVENOUS

## 2019-05-06 NOTE — ED Triage Notes (Signed)
Patient here with nausea and vomiting since Tuesday.  Patient has been on antibiotics for UTI.  Feels this may be from the antibiotics.  Patient denies fever, diarrhea.  Patient niece at side.

## 2019-05-06 NOTE — ED Provider Notes (Signed)
[facility] EMERGENCY DEPARTMENT Provider Note   CSN: [id number] Arrival date & time: 05/06/19  1337     History   Chief Complaint Chief Complaint  Patient presents with  . Emesis    HPI Cynthia Schwartz is a 58 y.o. female.     HPI  This patient is a 58 year old female, she has a known history of asthma, she has some special needs and is watched over by family members including her cousin who brings her to the emergency department today.  Reportedly the patient had recently had a possible urinary tract infection and was given cephalexin, 500 mg by mouth to be taken 3 times a day for 10 days.  After starting this medication she became more nauseated and since that time has not had a very good appetite, she has lost approximately 15 pounds and continues to be nauseated and not want to eat very much.  There is no fever, no diarrhea, she complains of no abdominal pain or urinary symptoms, no blood in the stool, swelling of the legs, rashes of the skin, sore throat, cough, shortness of breath, headache or any other complaints.  The majority of the history comes from the cousin who gives the historical events.  The medications that were brought with the patient include Zofran, cephalexin (there is half of the bottle left prescribed 11 days ago), fluoxetine which was started in the last couple of weeks after the patient became nauseated.  Evidently the patient had had a falling out with the mother prompting increasing depression.  The cousin believes that part of this is self-inflicted nausea for attention.  They were seen at the family doctor's office yesterday who did a urinalysis and told him that there may be a slight urinary infection but there was no signs of "kidney infection"  Past Medical History:  Diagnosis Date  . Asthma     There are no active problems to display for this patient.   Past Surgical History:  Procedure Laterality Date  . ovarian cyst removed    . spot on kidney        OB History   No obstetric history on file.      Home Medications    Prior to Admission medications   Medication Sig Start Date End Date Taking? Authorizing Provider  albuterol (PROVENTIL) (2.5 MG/3ML) 0.083% nebulizer solution Take 2.5 mg by nebulization every 6 (six) hours as needed for wheezing or shortness of breath.    [provider]  esomeprazole (NEXIUM) 20 MG capsule Take 20 mg by mouth daily at 12 noon.    [provider]  Fluticasone-Salmeterol (ADVAIR) 100-50 MCG/DOSE AEPB Inhale 1 puff into the lungs 2 (two) times daily.    [provider]  hyoscyamine (LEVSIN SL) 0.125 MG SL tablet Place 0.125 mg under the tongue 5 (five) times daily as needed for cramping.    [provider]  montelukast (SINGULAIR) 10 MG tablet Take 10 mg by mouth at bedtime.    [provider]  ondansetron (ZOFRAN) 4 MG tablet Take 1 tablet (4 mg total) by mouth every 8 (eight) hours as needed. 10/08/16   Rolland Porter, MD  potassium chloride (K-DUR) 10 MEQ tablet Take 1 tablet (10 mEq total) by mouth daily for 10 days. 05/06/19 05/16/19  Noemi Chapel, MD  promethazine (PHENERGAN) 12.5 MG tablet Take 1 tablet (12.5 mg total) by mouth every 6 (six) hours as needed for nausea or vomiting. 05/06/19   Noemi Chapel, MD  Family History History reviewed. No pertinent family history.  Social History Social History   Tobacco Use  . Smoking status: Never Smoker  . Smokeless tobacco: Never Used  Substance Use Topics  . Alcohol use: No  . Drug use: Not on file     Allergies   Patient has no known allergies.   Review of Systems Review of Systems  All other systems reviewed and are negative.    Physical Exam Updated Vital Signs BP 137/70 (BP Location: Right Arm)   Pulse (!) 53   Temp 98.9 F (37.2 C) (Oral)   Resp 12   Ht 1.626 m (5\' 4" )   Wt 59 kg   SpO2 100%   BMI 22.31 kg/m   Physical Exam Vitals signs and nursing note reviewed.   Constitutional:      General: She is not in acute distress.    Appearance: She is well-developed.  HENT:     Head: Normocephalic and atraumatic.     Comments: Mucous membranes are normal    Mouth/Throat:     Pharynx: No oropharyngeal exudate.  Eyes:     General: No scleral icterus.       Right eye: No discharge.        Left eye: No discharge.     Conjunctiva/sclera: Conjunctivae normal.     Pupils: Pupils are equal, round, and reactive to light.  Neck:     Musculoskeletal: Normal range of motion and neck supple.     Thyroid: No thyromegaly.     Vascular: No JVD.  Cardiovascular:     Rate and Rhythm: Normal rate and regular rhythm.     Heart sounds: Normal heart sounds. No murmur. No friction rub. No gallop.   Pulmonary:     Effort: Pulmonary effort is normal. No respiratory distress.     Breath sounds: Normal breath sounds. No wheezing or rales.  Abdominal:     General: Bowel sounds are normal. There is no distension.     Palpations: Abdomen is soft. There is no mass.     Tenderness: There is no abdominal tenderness.     Comments: The abdomen is very soft, there is no tenderness to palpation at all, there is no guarding or peritoneal signs, no Murphy sign, no right upper or right lower quadrant tenderness.  Musculoskeletal: Normal range of motion.        General: No tenderness.  Lymphadenopathy:     Cervical: No cervical adenopathy.  Skin:    General: Skin is warm and dry.     Findings: No erythema or rash.     Comments: There is a slight rash to the anterior right lower extremity just above the ankle, superficial, 2 to 3 mm in size, no drainage, no tenderness, no itching  Neurological:     General: No focal deficit present.     Mental Status: She is alert.     Coordination: Coordination normal.     Comments: The patient is moving all 4 extremities, she is able to follow commands without any difficulty, no facial droop, clear speech  Psychiatric:        Behavior: Behavior  normal.      ED Treatments / Results  Labs (all labs ordered are listed, but only abnormal results are displayed) Labs Reviewed  COMPREHENSIVE METABOLIC PANEL - Abnormal; Notable for the following components:      Result Value   Potassium 3.0 (*)    CO2 18 (*)    Anion  gap 16 (*)    All other components within normal limits  CBC - Abnormal; Notable for the following components:   Hemoglobin 15.5 (*)    HCT 46.4 (*)    All other components within normal limits  URINALYSIS, ROUTINE W REFLEX MICROSCOPIC - Abnormal; Notable for the following components:   Ketones, ur 5 (*)    Leukocytes,Ua LARGE (*)    Bacteria, UA RARE (*)    All other components within normal limits  URINE CULTURE  LIPASE, BLOOD    EKG None  Radiology No results found.  Procedures Procedures (including critical care time)  Medications Ordered in ED Medications  sodium chloride flush (NS) 0.9 % injection 3 mL (3 mLs Intravenous Given 05/06/19 2056)  magnesium sulfate IVPB 2 g 50 mL (0 g Intravenous Stopped 05/06/19 2049)  potassium chloride SA (K-DUR) CR tablet 40 mEq (40 mEq Oral Given 05/06/19 1842)  sodium chloride 0.9 % bolus 1,000 mL (0 mLs Intravenous Stopped 05/06/19 2049)     Initial Impression / Assessment and Plan / ED Course  I have reviewed the triage vital signs and the nursing notes.  Pertinent labs & imaging results that were available during my care of the patient were reviewed by me and considered in my medical decision making (see chart for details).  Clinical Course as of May 05 2142  Fri May 06, 2019  2136 UTI - present - abx added, culture ordered   [BM]  2136 Pt not having sx of urination - labs show low K, replacement started   [BM]    Clinical Course User Index [BM] Noemi Chapel, MD       The patient is hypokalemic at 3.0, she will need some magnesium and potassium IV fluids and nausea medicine.  We will recheck a urinalysis with a culture, she has no other significant  abnormal findings and at this time likely will be discharged home after hydration given.  I doubt an organic cause to her symptoms.  Cephalexin may have propagated this however she has been off that for several days  Patient well-appearing on repeat exam, she has been able to tolerate fluids, potassium has been given, magnesium replaced, urine culture sent, I do not feel strongly about treating this as a UTI at this time.  She has no renal dysfunction, no leukocytosis, no fever, no tachycardia and is otherwise well-appearing stating that she feels good.  Family member at the bedside, they will coordinate follow-up  Final Clinical Impressions(s) / ED Diagnoses   Final diagnoses:  Nauseated  Hypokalemia    ED Discharge Orders         Ordered    potassium chloride (K-DUR) 10 MEQ tablet  Daily     05/06/19 2141    promethazine (PHENERGAN) 12.5 MG tablet  Every 6 hours PRN     05/06/19 2141           Noemi Chapel, MD 05/06/19 2143

## 2019-05-06 NOTE — Discharge Instructions (Signed)
Please take the Phenergan as needed for nausea every 6 hours.  Drink plenty of fluids.  Take potassium daily for 10 days and have your family doctor recheck your potassium level.  Your urine culture should result within the next 2 days.  If you need an antibiotic 1 will be called in for you.  Emergency department for severe or worsening symptoms

## 2019-05-08 LAB — URINE CULTURE: Culture: NO GROWTH

## 2019-05-17 DIAGNOSIS — F419 Anxiety disorder, unspecified: Secondary | ICD-10-CM | POA: Diagnosis not present

## 2019-05-17 DIAGNOSIS — F819 Developmental disorder of scholastic skills, unspecified: Secondary | ICD-10-CM | POA: Diagnosis not present

## 2019-07-19 DIAGNOSIS — Z23 Encounter for immunization: Secondary | ICD-10-CM | POA: Diagnosis not present

## 2019-07-19 DIAGNOSIS — F419 Anxiety disorder, unspecified: Secondary | ICD-10-CM | POA: Diagnosis not present

## 2019-07-19 DIAGNOSIS — F819 Developmental disorder of scholastic skills, unspecified: Secondary | ICD-10-CM | POA: Diagnosis not present

## 2019-07-29 DIAGNOSIS — R05 Cough: Secondary | ICD-10-CM | POA: Diagnosis not present

## 2019-07-29 DIAGNOSIS — Z20828 Contact with and (suspected) exposure to other viral communicable diseases: Secondary | ICD-10-CM | POA: Diagnosis not present

## 2019-07-29 DIAGNOSIS — J45901 Unspecified asthma with (acute) exacerbation: Secondary | ICD-10-CM | POA: Diagnosis not present

## 2019-11-21 DIAGNOSIS — F819 Developmental disorder of scholastic skills, unspecified: Secondary | ICD-10-CM | POA: Diagnosis not present

## 2019-11-21 DIAGNOSIS — F419 Anxiety disorder, unspecified: Secondary | ICD-10-CM | POA: Diagnosis not present

## 2019-11-21 DIAGNOSIS — R05 Cough: Secondary | ICD-10-CM | POA: Diagnosis not present

## 2019-11-21 DIAGNOSIS — J4522 Mild intermittent asthma with status asthmaticus: Secondary | ICD-10-CM | POA: Diagnosis not present

## 2019-12-21 DIAGNOSIS — J4541 Moderate persistent asthma with (acute) exacerbation: Secondary | ICD-10-CM | POA: Diagnosis not present

## 2019-12-21 DIAGNOSIS — J309 Allergic rhinitis, unspecified: Secondary | ICD-10-CM | POA: Diagnosis not present

## 2020-02-20 DIAGNOSIS — R05 Cough: Secondary | ICD-10-CM | POA: Diagnosis not present

## 2020-02-20 DIAGNOSIS — Z1331 Encounter for screening for depression: Secondary | ICD-10-CM | POA: Diagnosis not present

## 2020-02-20 DIAGNOSIS — Z1389 Encounter for screening for other disorder: Secondary | ICD-10-CM | POA: Diagnosis not present

## 2020-02-20 DIAGNOSIS — F819 Developmental disorder of scholastic skills, unspecified: Secondary | ICD-10-CM | POA: Diagnosis not present

## 2020-02-20 DIAGNOSIS — F419 Anxiety disorder, unspecified: Secondary | ICD-10-CM | POA: Diagnosis not present

## 2020-02-20 DIAGNOSIS — K219 Gastro-esophageal reflux disease without esophagitis: Secondary | ICD-10-CM | POA: Diagnosis not present

## 2020-04-30 ENCOUNTER — Other Ambulatory Visit: Payer: Self-pay

## 2020-04-30 ENCOUNTER — Ambulatory Visit (HOSPITAL_COMMUNITY)
Admission: RE | Admit: 2020-04-30 | Discharge: 2020-04-30 | Disposition: A | Discharge status: Home / Self Care | Payer: Medicare Other | Source: Ambulatory Visit | Attending: Family Medicine | Admitting: Family Medicine

## 2020-04-30 ENCOUNTER — Other Ambulatory Visit (HOSPITAL_COMMUNITY): Payer: Self-pay | Admitting: Family Medicine

## 2020-04-30 ENCOUNTER — Other Ambulatory Visit: Payer: Self-pay | Admitting: Family Medicine

## 2020-04-30 DIAGNOSIS — F819 Developmental disorder of scholastic skills, unspecified: Secondary | ICD-10-CM | POA: Diagnosis not present

## 2020-04-30 DIAGNOSIS — M79604 Pain in right leg: Secondary | ICD-10-CM

## 2020-04-30 DIAGNOSIS — R600 Localized edema: Secondary | ICD-10-CM | POA: Diagnosis not present

## 2020-06-20 DIAGNOSIS — J4530 Mild persistent asthma, uncomplicated: Secondary | ICD-10-CM | POA: Diagnosis not present

## 2020-06-20 DIAGNOSIS — R059 Cough, unspecified: Secondary | ICD-10-CM | POA: Diagnosis not present

## 2020-06-20 DIAGNOSIS — K219 Gastro-esophageal reflux disease without esophagitis: Secondary | ICD-10-CM | POA: Diagnosis not present

## 2020-06-20 DIAGNOSIS — F419 Anxiety disorder, unspecified: Secondary | ICD-10-CM | POA: Diagnosis not present

## 2020-06-20 DIAGNOSIS — Z23 Encounter for immunization: Secondary | ICD-10-CM | POA: Diagnosis not present

## 2020-06-20 DIAGNOSIS — F819 Developmental disorder of scholastic skills, unspecified: Secondary | ICD-10-CM | POA: Diagnosis not present

## 2020-06-20 DIAGNOSIS — Z6828 Body mass index (BMI) 28.0-28.9, adult: Secondary | ICD-10-CM | POA: Diagnosis not present

## 2020-12-18 DIAGNOSIS — F819 Developmental disorder of scholastic skills, unspecified: Secondary | ICD-10-CM | POA: Diagnosis not present

## 2020-12-18 DIAGNOSIS — J4530 Mild persistent asthma, uncomplicated: Secondary | ICD-10-CM | POA: Diagnosis not present

## 2020-12-18 DIAGNOSIS — R609 Edema, unspecified: Secondary | ICD-10-CM | POA: Diagnosis not present

## 2021-04-22 DIAGNOSIS — J4530 Mild persistent asthma, uncomplicated: Secondary | ICD-10-CM | POA: Diagnosis not present

## 2021-04-22 DIAGNOSIS — Z20828 Contact with and (suspected) exposure to other viral communicable diseases: Secondary | ICD-10-CM | POA: Diagnosis not present

## 2021-05-28 DIAGNOSIS — H52223 Regular astigmatism, bilateral: Secondary | ICD-10-CM | POA: Diagnosis not present

## 2021-05-28 DIAGNOSIS — H5203 Hypermetropia, bilateral: Secondary | ICD-10-CM | POA: Diagnosis not present

## 2021-05-28 DIAGNOSIS — H338 Other retinal detachments: Secondary | ICD-10-CM | POA: Diagnosis not present

## 2021-05-28 DIAGNOSIS — H524 Presbyopia: Secondary | ICD-10-CM | POA: Diagnosis not present

## 2021-05-28 DIAGNOSIS — H2513 Age-related nuclear cataract, bilateral: Secondary | ICD-10-CM | POA: Diagnosis not present

## 2021-06-15 DIAGNOSIS — Z23 Encounter for immunization: Secondary | ICD-10-CM | POA: Diagnosis not present

## 2021-06-28 DIAGNOSIS — Z1329 Encounter for screening for other suspected endocrine disorder: Secondary | ICD-10-CM | POA: Diagnosis not present

## 2021-06-28 DIAGNOSIS — F819 Developmental disorder of scholastic skills, unspecified: Secondary | ICD-10-CM | POA: Diagnosis not present

## 2021-06-28 DIAGNOSIS — Z13220 Encounter for screening for lipoid disorders: Secondary | ICD-10-CM | POA: Diagnosis not present

## 2021-06-28 DIAGNOSIS — Q619 Cystic kidney disease, unspecified: Secondary | ICD-10-CM | POA: Diagnosis not present

## 2021-07-02 DIAGNOSIS — F819 Developmental disorder of scholastic skills, unspecified: Secondary | ICD-10-CM | POA: Diagnosis not present

## 2021-07-02 DIAGNOSIS — Z23 Encounter for immunization: Secondary | ICD-10-CM | POA: Diagnosis not present

## 2021-07-02 DIAGNOSIS — J4530 Mild persistent asthma, uncomplicated: Secondary | ICD-10-CM | POA: Diagnosis not present

## 2021-07-02 DIAGNOSIS — Z6827 Body mass index (BMI) 27.0-27.9, adult: Secondary | ICD-10-CM | POA: Diagnosis not present

## 2021-10-01 DIAGNOSIS — J4541 Moderate persistent asthma with (acute) exacerbation: Secondary | ICD-10-CM | POA: Diagnosis not present

## 2021-10-01 DIAGNOSIS — R059 Cough, unspecified: Secondary | ICD-10-CM | POA: Diagnosis not present

## 2021-10-01 DIAGNOSIS — Z6826 Body mass index (BMI) 26.0-26.9, adult: Secondary | ICD-10-CM | POA: Diagnosis not present

## 2021-11-14 DIAGNOSIS — Z6827 Body mass index (BMI) 27.0-27.9, adult: Secondary | ICD-10-CM | POA: Diagnosis not present

## 2021-11-14 DIAGNOSIS — K625 Hemorrhage of anus and rectum: Secondary | ICD-10-CM | POA: Diagnosis not present

## 2021-11-14 DIAGNOSIS — D519 Vitamin B12 deficiency anemia, unspecified: Secondary | ICD-10-CM | POA: Diagnosis not present

## 2021-12-02 DIAGNOSIS — Z23 Encounter for immunization: Secondary | ICD-10-CM | POA: Diagnosis not present

## 2021-12-31 DIAGNOSIS — Z6827 Body mass index (BMI) 27.0-27.9, adult: Secondary | ICD-10-CM | POA: Diagnosis not present

## 2021-12-31 DIAGNOSIS — J45901 Unspecified asthma with (acute) exacerbation: Secondary | ICD-10-CM | POA: Diagnosis not present

## 2021-12-31 DIAGNOSIS — R062 Wheezing: Secondary | ICD-10-CM | POA: Diagnosis not present

## 2021-12-31 DIAGNOSIS — I10 Essential (primary) hypertension: Secondary | ICD-10-CM | POA: Diagnosis not present

## 2022-02-04 DIAGNOSIS — Z1231 Encounter for screening mammogram for malignant neoplasm of breast: Secondary | ICD-10-CM | POA: Diagnosis not present

## 2022-02-06 ENCOUNTER — Encounter (INDEPENDENT_AMBULATORY_CARE_PROVIDER_SITE_OTHER): Payer: Self-pay | Admitting: *Deleted

## 2022-03-13 DIAGNOSIS — N61 Mastitis without abscess: Secondary | ICD-10-CM | POA: Diagnosis not present

## 2022-03-13 DIAGNOSIS — Z6828 Body mass index (BMI) 28.0-28.9, adult: Secondary | ICD-10-CM | POA: Diagnosis not present

## 2022-03-13 DIAGNOSIS — R030 Elevated blood-pressure reading, without diagnosis of hypertension: Secondary | ICD-10-CM | POA: Diagnosis not present

## 2022-03-13 DIAGNOSIS — K2960 Other gastritis without bleeding: Secondary | ICD-10-CM | POA: Diagnosis not present

## 2022-03-31 ENCOUNTER — Encounter (INDEPENDENT_AMBULATORY_CARE_PROVIDER_SITE_OTHER): Payer: Self-pay | Admitting: Gastroenterology

## 2022-03-31 ENCOUNTER — Ambulatory Visit (INDEPENDENT_AMBULATORY_CARE_PROVIDER_SITE_OTHER): Payer: Medicare Other | Admitting: Gastroenterology

## 2022-03-31 ENCOUNTER — Encounter (INDEPENDENT_AMBULATORY_CARE_PROVIDER_SITE_OTHER): Payer: Self-pay | Admitting: *Deleted

## 2022-04-07 ENCOUNTER — Encounter (INDEPENDENT_AMBULATORY_CARE_PROVIDER_SITE_OTHER): Payer: Self-pay | Admitting: *Deleted

## 2022-06-30 ENCOUNTER — Encounter (INDEPENDENT_AMBULATORY_CARE_PROVIDER_SITE_OTHER): Payer: Self-pay | Admitting: Gastroenterology

## 2022-06-30 ENCOUNTER — Encounter (INDEPENDENT_AMBULATORY_CARE_PROVIDER_SITE_OTHER): Payer: Self-pay

## 2022-06-30 ENCOUNTER — Telehealth (INDEPENDENT_AMBULATORY_CARE_PROVIDER_SITE_OTHER): Payer: Self-pay

## 2022-06-30 ENCOUNTER — Other Ambulatory Visit (INDEPENDENT_AMBULATORY_CARE_PROVIDER_SITE_OTHER): Payer: Self-pay

## 2022-06-30 ENCOUNTER — Ambulatory Visit (INDEPENDENT_AMBULATORY_CARE_PROVIDER_SITE_OTHER): Payer: Medicare Other | Admitting: Gastroenterology

## 2022-06-30 VITALS — BP 122/72 | HR 60 | Temp 97.4°F | Ht 64.0 in | Wt 180.0 lb

## 2022-06-30 DIAGNOSIS — K625 Hemorrhage of anus and rectum: Secondary | ICD-10-CM

## 2022-06-30 MED ORDER — PEG 3350-KCL-NA BICARB-NACL 420 G PO SOLR: 4000.0000 mL | ORAL | 0 refills | Status: DC

## 2022-06-30 NOTE — Telephone Encounter (Signed)
Cynthia Schwartz Cynthia Schwartz, CMA  ?

## 2022-06-30 NOTE — Patient Instructions (Signed)
It was nice to meet you! As discussed, we will get you scheduled for a colonoscopy due to rectal bleeding Make sure you are drinking plenty of water and eating a diet high in fruits and veggies  Follow up 3 months

## 2022-06-30 NOTE — Progress Notes (Addendum)
Referring Provider: Lanelle Bal, PA-C Primary Care Physician:  Lanelle Bal, PA-C Primary GI Physician: new  Chief Complaint  Patient presents with   Rectal Bleeding    New patient. Referred for rectal bleeding.    HPI:   Cynthia Schwartz is a 61 y.o. female with past medical history of asthma, cognitive impairment  Patient presenting today as a new patient for rectal bleeding.   Positive hemosure in May 2023, hgb 14. 4 in march 2023.   Patient states that she has been having some rectal bleeding, but is unsure how long. She states that his occurs maybe once per week. She has occasional abdominal pain that she thinks gets better if she has a BM. Reports 2-3 BMs per day. She denies diarrhea, does not feel that she has any difficulty having a BM. She feels that she is eating good. She states that she lives with her sister but she cares for herself. She denies any weight loss. She denies any rectal pain, itching or burning. She endorses good water intake.   History intake was somewhat difficult given patient's history of cognitive impairment. I did speak with her sister/caregiver Cynthia Schwartz, separately and she endorsed that patient has had some rectal bleeding though she is unsure of frequency or amount. She does not know much about patient's bowel habits and does not think she has ever had a colonoscopy before.    NSAID use: just tylenol for headaches  Social hx: no tobacco, has occasional alcohol  Fam hx: unsure of family history   Last Colonoscopy: never Last Endoscopy:never  Recommendations:    Past Medical History:  Diagnosis Date   Asthma     Past Surgical History:  Procedure Laterality Date   ovarian cyst removed     spot on kidney      Current Outpatient Medications  Medication Sig Dispense Refill   albuterol (PROVENTIL) (2.5 MG/3ML) 0.083% nebulizer solution Take 2.5 mg by nebulization every 6 (six) hours as needed for wheezing or shortness of breath.     famotidine  (PEPCID) 20 MG tablet Take 20 mg by mouth daily.     FLUoxetine (PROZAC) 20 MG capsule Take 20 mg by mouth daily.     montelukast (SINGULAIR) 10 MG tablet Take 10 mg by mouth at bedtime.     omeprazole (PRILOSEC) 20 MG capsule Take 20 mg by mouth daily.     potassium chloride (K-DUR) 10 MEQ tablet Take 1 tablet (10 mEq total) by mouth daily for 10 days. 10 tablet 0   promethazine (PHENERGAN) 12.5 MG tablet Take 1 tablet (12.5 mg total) by mouth every 6 (six) hours as needed for nausea or vomiting. 12 tablet 0   No current facility-administered medications for this visit.    Allergies as of 06/30/2022   (No Known Allergies)    No family history on file.  Social History   Socioeconomic History   Marital status: Single    Spouse name: Not on file   Number of children: Not on file   Years of education: Not on file   Highest education level: Not on file  Occupational History   Not on file  Tobacco Use   Smoking status: Never   Smokeless tobacco: Never  Substance and Sexual Activity   Alcohol use: No   Drug use: Not on file   Sexual activity: Not on file  Other Topics Concern   Not on file  Social History Narrative   Not on file   Social  Determinants of Health   Financial Resource Strain: Not on file  Food Insecurity: Not on file  Transportation Needs: Not on file  Physical Activity: Not on file  Stress: Not on file  Social Connections: Not on file   Review of systems General: negative for malaise, night sweats, fever, chills, weight loss Neck: Negative for lumps, goiter, pain and significant neck swelling Resp: Negative for cough, wheezing, dyspnea at rest CV: Negative for chest pain, leg swelling, palpitations, orthopnea GI: denies melena,nausea, vomiting, diarrhea, constipation, dysphagia, odyonophagia, early satiety or unintentional weight loss. +rectal bleeding MSK: Negative for joint pain or swelling, back pain, and muscle pain. Derm: Negative for itching or  rash Psych: Denies depression, anxiety, memory loss, confusion. No homicidal or suicidal ideation.  Heme: Negative for prolonged bleeding, bruising easily, and swollen nodes. Endocrine: Negative for cold or heat intolerance, polyuria, polydipsia and goiter. Neuro: negative for tremor, gait imbalance, syncope and seizures. The remainder of the review of systems is noncontributory.  Physical Exam: There were no vitals taken for this visit. General:   Alert and oriented. No distress noted. Pleasant and cooperative.  Head:  Normocephalic and atraumatic. Eyes:  Conjuctiva clear without scleral icterus. Mouth:  Oral mucosa pink and moist. Good dentition. No lesions. Heart: Normal rate and rhythm, s1 and s2 heart sounds present.  Lungs: Clear lung sounds in all lobes. Respirations equal and unlabored. Abdomen:  +BS, soft, non-tender and non-distended. No rebound or guarding. No HSM or masses noted. Derm: No palmar erythema or jaundice Msk:  Symmetrical without gross deformities. Normal posture. Extremities:  Without edema. Neurologic:  Alert and  oriented x4 Psych:  Alert and cooperative. Normal mood and affect.  Invalid input(s): "6 MONTHS"   ASSESSMENT: Cynthia Schwartz is a 61 y.o. female presenting today as a new patient for rectal bleeding.  Patient reports rectal bleeding occurring maybe once per week, usually when wiping. Denies rectal pain, itching or burning. She denies constipation or diarrhea, reporting 2-3 BMs per day. Due to cognitive impairment, history was difficult to obtain. She resides with her sister who was also not able to provide much history other than patient had reported some rectal bleeding to her a while back and stool testing at her PCP was positive. She notes patient stays in the restroom for a while at times, she wonders if constipation is an issue but she is not completely sure as the patient is mostly independent with her ADLs. Recommend proceeding with colonoscopy for  further evaluation of rectal bleeding as we cannot rule out source of hemorrhoids, bleeding polyps, AMVs, or malignancy. Indications, risks and benefits of procedure discussed in detail with patient and her sister/caretaker. Both Patient and her sister Cynthia Schwartz verbalized understanding and are in agreement to proceed with Colonoscopy at this time.    PLAN:  Schedule Colonoscopy ASA II ENDO 1 2. Ample water intake  3. Diet high in fruits and veggies   All questions were answered, patient verbalized understanding and is in agreement with plan as outlined above.    Follow Up: 3 months   Tabbetha Kutscher L. Alver Sorrow, MSN, APRN, AGNP-C Adult-Gerontology Nurse Practitioner [facility]  I have reviewed the note and agree with the APP's assessment as described in this progress note  Maylon Peppers, MD Gastroenterology and Hepatology [facility]

## 2022-07-03 DIAGNOSIS — F819 Developmental disorder of scholastic skills, unspecified: Secondary | ICD-10-CM | POA: Diagnosis not present

## 2022-07-03 DIAGNOSIS — Z0001 Encounter for general adult medical examination with abnormal findings: Secondary | ICD-10-CM | POA: Diagnosis not present

## 2022-07-03 DIAGNOSIS — Z0000 Encounter for general adult medical examination without abnormal findings: Secondary | ICD-10-CM | POA: Diagnosis not present

## 2022-07-03 DIAGNOSIS — R5383 Other fatigue: Secondary | ICD-10-CM | POA: Diagnosis not present

## 2022-07-03 DIAGNOSIS — Z23 Encounter for immunization: Secondary | ICD-10-CM | POA: Diagnosis not present

## 2022-07-03 DIAGNOSIS — Z13220 Encounter for screening for lipoid disorders: Secondary | ICD-10-CM | POA: Diagnosis not present

## 2022-07-03 DIAGNOSIS — J4530 Mild persistent asthma, uncomplicated: Secondary | ICD-10-CM | POA: Diagnosis not present

## 2022-07-03 DIAGNOSIS — Z6829 Body mass index (BMI) 29.0-29.9, adult: Secondary | ICD-10-CM | POA: Diagnosis not present

## 2022-07-03 DIAGNOSIS — R030 Elevated blood-pressure reading, without diagnosis of hypertension: Secondary | ICD-10-CM | POA: Diagnosis not present

## 2022-07-28 ENCOUNTER — Telehealth (INDEPENDENT_AMBULATORY_CARE_PROVIDER_SITE_OTHER): Payer: Self-pay | Admitting: *Deleted

## 2022-07-28 MED ORDER — PEG 3350-KCL-NA BICARB-NACL 420 G PO SOLR: 4000.0000 mL | Freq: Once | ORAL | 0 refills | Status: AC

## 2022-07-28 NOTE — Telephone Encounter (Signed)
Received call in that they called [facility] and rx for prep was not there. I advised will send new rx as I was sent to [facility]. She voiced understanding

## 2022-08-05 ENCOUNTER — Telehealth (INDEPENDENT_AMBULATORY_CARE_PROVIDER_SITE_OTHER): Payer: Self-pay | Admitting: *Deleted

## 2022-08-05 NOTE — Telephone Encounter (Signed)
Received call from [facility] in endo stating pt was confused by her instructions for tomorrow procedure and she did not start clear liquid diet. Has also been eating today. Needs to r/s.  Called pt and spoke with caregiver. Patient rescheduled to 1/10 at 1:15pm. Already has prep at home and does not need new rx. Will send new instructions. Confirmed address.

## 2022-08-22 DIAGNOSIS — Z23 Encounter for immunization: Secondary | ICD-10-CM | POA: Diagnosis not present

## 2022-09-17 ENCOUNTER — Ambulatory Visit (HOSPITAL_COMMUNITY)
Admission: RE | Admit: 2022-09-17 | Discharge: 2022-09-17 | Disposition: A | Discharge status: Home / Self Care | Payer: Medicare Other | Attending: Gastroenterology | Admitting: Gastroenterology

## 2022-09-17 ENCOUNTER — Encounter (HOSPITAL_COMMUNITY): Payer: Self-pay | Admitting: Gastroenterology

## 2022-09-17 ENCOUNTER — Encounter (HOSPITAL_COMMUNITY)
Admission: RE | Disposition: A | Discharge status: Home / Self Care | Payer: Self-pay | Source: Home / Self Care | Attending: Gastroenterology

## 2022-09-17 ENCOUNTER — Other Ambulatory Visit: Payer: Self-pay

## 2022-09-17 ENCOUNTER — Ambulatory Visit (HOSPITAL_BASED_OUTPATIENT_CLINIC_OR_DEPARTMENT_OTHER): Payer: Medicare Other | Admitting: Anesthesiology

## 2022-09-17 ENCOUNTER — Ambulatory Visit (HOSPITAL_COMMUNITY): Payer: Medicare Other | Admitting: Anesthesiology

## 2022-09-17 DIAGNOSIS — K635 Polyp of colon: Secondary | ICD-10-CM | POA: Diagnosis not present

## 2022-09-17 DIAGNOSIS — K649 Unspecified hemorrhoids: Secondary | ICD-10-CM

## 2022-09-17 DIAGNOSIS — K529 Noninfective gastroenteritis and colitis, unspecified: Secondary | ICD-10-CM | POA: Insufficient documentation

## 2022-09-17 DIAGNOSIS — K629 Disease of anus and rectum, unspecified: Secondary | ICD-10-CM | POA: Diagnosis not present

## 2022-09-17 DIAGNOSIS — K648 Other hemorrhoids: Secondary | ICD-10-CM | POA: Insufficient documentation

## 2022-09-17 DIAGNOSIS — K633 Ulcer of intestine: Secondary | ICD-10-CM

## 2022-09-17 DIAGNOSIS — J45909 Unspecified asthma, uncomplicated: Secondary | ICD-10-CM | POA: Insufficient documentation

## 2022-09-17 DIAGNOSIS — K625 Hemorrhage of anus and rectum: Secondary | ICD-10-CM | POA: Insufficient documentation

## 2022-09-17 DIAGNOSIS — D122 Benign neoplasm of ascending colon: Secondary | ICD-10-CM | POA: Diagnosis not present

## 2022-09-17 DIAGNOSIS — K51911 Ulcerative colitis, unspecified with rectal bleeding: Secondary | ICD-10-CM | POA: Diagnosis not present

## 2022-09-17 HISTORY — PX: POLYPECTOMY: SHX5525

## 2022-09-17 HISTORY — PX: COLONOSCOPY WITH PROPOFOL: SHX5780

## 2022-09-17 HISTORY — PX: BIOPSY: SHX5522

## 2022-09-17 LAB — COMPREHENSIVE METABOLIC PANEL
ALT: 12 U/L (ref 0–44)
AST: 13 U/L — ABNORMAL LOW (ref 15–41)
Albumin: 3.6 g/dL (ref 3.5–5.0)
Alkaline Phosphatase: 64 U/L (ref 38–126)
Anion gap: 5 (ref 5–15)
BUN: 8 mg/dL (ref 8–23)
CO2: 26 mmol/L (ref 22–32)
Calcium: 8.1 mg/dL — ABNORMAL LOW (ref 8.9–10.3)
Chloride: 109 mmol/L (ref 98–111)
Creatinine, Ser: 0.91 mg/dL (ref 0.44–1.00)
GFR, Estimated: >60 mL/min (ref >60)
Glucose, Bld: 88 mg/dL (ref 70–99)
Potassium: 3.6 mmol/L (ref 3.5–5.1)
Sodium: 140 mmol/L (ref 135–145)
Total Bilirubin: 0.6 mg/dL (ref 0.3–1.2)
Total Protein: 6.2 g/dL — ABNORMAL LOW (ref 6.5–8.1)

## 2022-09-17 LAB — C-REACTIVE PROTEIN: CRP: 0.7 mg/dL (ref <1.0)

## 2022-09-17 LAB — CBC
HCT: 41.3 % (ref 36.0–46.0)
Hemoglobin: 13.6 g/dL (ref 12.0–15.0)
MCH: 31 pg (ref 26.0–34.0)
MCHC: 32.9 g/dL (ref 30.0–36.0)
MCV: 94.1 fL (ref 80.0–100.0)
Platelets: 183 10*3/uL (ref 150–400)
RBC: 4.39 MIL/uL (ref 3.87–5.11)
RDW: 12.7 % (ref 11.5–15.5)
WBC: 6.2 10*3/uL (ref 4.0–10.5)
nRBC: 0 % (ref 0.0–0.2)

## 2022-09-17 SURGERY — COLONOSCOPY WITH PROPOFOL
Anesthesia: General

## 2022-09-17 MED ORDER — PROPOFOL 500 MG/50ML IV EMUL
INTRAVENOUS | Status: AC
  Filled 2022-09-17: qty 50

## 2022-09-17 MED ORDER — LACTATED RINGERS IV SOLN: INTRAVENOUS | Status: DC | PRN

## 2022-09-17 MED ORDER — PROPOFOL 500 MG/50ML IV EMUL
INTRAVENOUS | Status: DC | PRN
  Administered 2022-09-17: 150 ug/kg/min via INTRAVENOUS

## 2022-09-17 MED ORDER — LACTATED RINGERS IV SOLN: INTRAVENOUS | Status: DC

## 2022-09-17 MED ORDER — PROPOFOL 10 MG/ML IV BOLUS
INTRAVENOUS | Status: DC | PRN
  Administered 2022-09-17: 60 mg via INTRAVENOUS

## 2022-09-17 NOTE — H&P (Signed)
Cynthia Schwartz is an 62 y.o. female.   Chief Complaint: rectal bleeding HPI: Cynthia Schwartz is a 62 y.o. female with past medical history of asthma, cognitive impairment coming for rectal bleeding.  Patient comes with caregiver who reports she has had intermittent rectal bleeding. Never had a colonoscopy.  Past Medical History:  Diagnosis Date   Asthma     Past Surgical History:  Procedure Laterality Date   ovarian cyst removed     spot on kidney      No family history on file. Social History:  reports that she has never smoked. She has never been exposed to tobacco smoke. She has never used smokeless tobacco. She reports that she does not drink alcohol. No history on file for drug use.  Allergies: No Known Allergies  Medications Prior to Admission  Medication Sig Dispense Refill   albuterol (PROVENTIL) (2.5 MG/3ML) 0.083% nebulizer solution Take 2.5 mg by nebulization every 6 (six) hours as needed for wheezing or shortness of breath.     famotidine (PEPCID) 20 MG tablet Take 20 mg by mouth daily.     FLUoxetine (PROZAC) 20 MG capsule Take 20 mg by mouth daily.     montelukast (SINGULAIR) 10 MG tablet Take 10 mg by mouth at bedtime.     omeprazole (PRILOSEC) 20 MG capsule Take 20 mg by mouth daily.     polyethylene glycol-electrolytes (TRILYTE) 420 g solution Take 4,000 mLs by mouth as directed. 4000 mL 0    No results found for this or any previous visit (from the past 48 hour(s)). No results found.  Review of Systems  Gastrointestinal:  Positive for blood in stool.  All other systems reviewed and are negative.   Blood pressure (!) 161/81, pulse 69, temperature 98.5 F (36.9 C), temperature source Oral, resp. rate 18, height '5\' 4"'$  (1.626 m), weight 77.1 kg, SpO2 95 %. Physical Exam  GENERAL: The patient is awake, in no acute distress. HEENT: Head is normocephalic and atraumatic. EOMI are intact. Mouth is well hydrated and without lesions. NECK: Supple. No masses LUNGS:  Clear to auscultation. No presence of rhonchi/wheezing/rales. Adequate chest expansion HEART: RRR, normal s1 and s2. ABDOMEN: Soft, nontender, no guarding, no peritoneal signs, and nondistended. BS +. No masses. EXTREMITIES: Without any cyanosis, clubbing, rash, lesions or edema. NEUROLOGIC: no focal motor deficit. SKIN: no jaundice, no rashes  Assessment/Plan Cynthia Schwartz is a 62 y.o. female with past medical history of asthma, cognitive impairment coming for rectal bleeding.Will proceed with colonoscopy  Harvel Quale, MD 09/17/2022, 1:04 PM

## 2022-09-17 NOTE — Transfer of Care (Signed)
Immediate Anesthesia Transfer of Care Note  Patient: Cynthia Schwartz  Procedure(s) Performed: COLONOSCOPY WITH PROPOFOL BIOPSY POLYPECTOMY  Patient Location: PACU  Anesthesia Type:General  Level of Consciousness: awake, alert , and oriented  Airway & Oxygen Therapy: Patient Spontanous Breathing  Post-op Assessment: Report given to RN, Post -op Vital signs reviewed and stable, Patient moving all extremities X 4, and Patient able to stick tongue midline  Post vital signs: Reviewed  Last Vitals:  Vitals Value Taken Time  BP 106/59   Temp 97.6   Pulse 53   Resp 12   SpO2 96     Last Pain:  Vitals:   09/17/22 1159  TempSrc: Oral  PainSc: 0-No pain      Patients Stated Pain Goal: 5 (43/60/16 5800)  Complications: No notable events documented.

## 2022-09-17 NOTE — Discharge Instructions (Addendum)
You are being discharged to home.  Resume your previous diet.  We are waiting for your pathology results.  Your physician has recommended a repeat colonoscopy for surveillance based on pathology results.  Do not take any ibuprofen (including Advil, Motrin or Nuprin), naproxen, or other non-steroidal anti-inflammatory drugs.

## 2022-09-17 NOTE — Anesthesia Preprocedure Evaluation (Signed)
Anesthesia Evaluation  Patient identified by MRN, date of birth, ID band Patient awake    Reviewed: Allergy & Precautions, H&P , NPO status , Patient's Chart, lab work & pertinent test results, reviewed documented beta blocker date and time   Airway Mallampati: II  TM Distance: >3 FB Neck ROM: full    Dental no notable dental hx.    Pulmonary neg pulmonary ROS, asthma    Pulmonary exam normal breath sounds clear to auscultation       Cardiovascular Exercise Tolerance: Good negative cardio ROS  Rhythm:regular Rate:Normal     Neuro/Psych negative neurological ROS  negative psych ROS   GI/Hepatic negative GI ROS, Neg liver ROS,,,  Endo/Other  negative endocrine ROS    Renal/GU negative Renal ROS  negative genitourinary   Musculoskeletal   Abdominal   Peds  Hematology negative hematology ROS (+)   Anesthesia Other Findings   Reproductive/Obstetrics negative OB ROS                             Anesthesia Physical Anesthesia Plan  ASA: 2  Anesthesia Plan: General   Post-op Pain Management:    Induction:   PONV Risk Score and Plan: Propofol infusion  Airway Management Planned:   Additional Equipment:   Intra-op Plan:   Post-operative Plan:   Informed Consent: I have reviewed the patients History and Physical, chart, labs and discussed the procedure including the risks, benefits and alternatives for the proposed anesthesia with the patient or authorized representative who has indicated his/her understanding and acceptance.     Dental Advisory Given  Plan Discussed with: CRNA  Anesthesia Plan Comments:        Anesthesia Quick Evaluation

## 2022-09-17 NOTE — Op Note (Signed)
[facility] Patient Name: Cynthia Schwartz Procedure Date: 09/17/2022 1:53 PM MRN: [id number] Date of Birth: 1960/12/10 Attending MD: Maylon Peppers , , [id number] CSN: [id number] Age: 62 Admit Type: Outpatient Procedure:                Colonoscopy Indications:              Rectal bleeding Providers:                Maylon Peppers, Janeece Riggers, RN, Everardo Pacific Referring MD:              Medicines:                Monitored Anesthesia Care Complications:            No immediate complications. Estimated Blood Loss:     Estimated blood loss: none. Procedure:                Pre-Anesthesia Assessment:                           - Prior to the procedure, a History and Physical                            was performed, and patient medications, allergies                            and sensitivities were reviewed. The patient's                            tolerance of previous anesthesia was reviewed.                           - The risks and benefits of the procedure and the                            sedation options and risks were discussed with the                            patient. All questions were answered and informed                            consent was obtained.                           - ASA Grade Assessment: II - A patient with mild                            systemic disease.                           After obtaining informed consent, the colonoscope                            was passed under direct vision. Throughout the                            procedure, the patient's blood pressure, pulse, and  oxygen saturations were monitored continuously. The                            [id number]-[id number] ([id number]) scope was introduced through                            the anus and advanced to the the terminal ileum.                            The colonoscopy was performed without difficulty.                            The patient tolerated the procedure  well. The                            quality of the bowel preparation was good. Scope In: 2:08:04 PM Scope Out: 2:32:58 PM Scope Withdrawal Time: 0 hours 17 minutes 35 seconds  Total Procedure Duration: 0 hours 24 minutes 54 seconds  Findings:      The perianal and digital rectal examinations were normal.      The terminal ileum appeared normal. Biopsies were taken with a cold       forceps for histology.      Inflammation characterized by aphthous ulcerations was found as patches       surrounded by normal mucosa in the transverse colon, in the ascending       colon and at the cecum. The rectum, the sigmoid colon and the descending       colon were spared. When compared to previous examinations, the findings       are new. Biopsies were taken with a cold forceps for histology.      A 4 mm polyp was found in the ascending colon. The polyp was sessile.       The polyp was removed with a cold snare. Resection and retrieval were       complete.      Non-bleeding internal hemorrhoids were found during retroflexion. One of       the hemorrhoids had a cherry red area in the tip, possibly from recent       bleeding but not actively bleeding. The hemorrhoids were moderate. Impression:               - The examined portion of the ileum was normal.                            Biopsied.                           - Exam performed to rule out Crohn's disease.                            Inflammation was found in the transverse colon, in                            the ascending colon and at the cecum., new compared  to previous examinations. Biopsied.                           - One 4 mm polyp in the ascending colon, removed                            with a cold snare. Resected and retrieved.                           - Non-bleeding internal hemorrhoids. Moderate Sedation:      Per Anesthesia Care Recommendation:           - Discharge patient to home (ambulatory).                            - Resume previous diet.                           - Await pathology results.                           - Repeat colonoscopy for surveillance based on                            pathology results.                           - Check hepatitis B s Ag, Quantiferon, CBC, CMP and                            CRP.                           - No ibuprofen, naproxen, or other non-steroidal                            anti-inflammatory drugs. Procedure Code(s):        --- Professional ---                           [phone], Colonoscopy, flexible; with removal of                            tumor(s), polyp(s), or other lesion(s) by snare                            technique                           45380, 88, Colonoscopy, flexible; with biopsy,                            single or multiple Diagnosis Code(s):        --- Professional ---                           K64.8, Other hemorrhoids  K52.9, Noninfective gastroenteritis and colitis,                            unspecified                           D12.2, Benign neoplasm of ascending colon                           K62.5, Hemorrhage of anus and rectum CPT copyright 2022 American Medical Association. All rights reserved. The codes documented in this report are preliminary and upon coder review may  be revised to meet current compliance requirements. Maylon Peppers, MD Maylon Peppers,  09/17/2022 2:48:03 PM This report has been signed electronically. Number of Addenda: 0

## 2022-09-19 LAB — MISC LABCORP TEST (SEND OUT): Labcorp test code: 6510

## 2022-09-20 NOTE — Anesthesia Postprocedure Evaluation (Signed)
Anesthesia Post Note  Patient: Cynthia Schwartz  Procedure(s) Performed: COLONOSCOPY WITH PROPOFOL BIOPSY POLYPECTOMY  Patient location during evaluation: Phase II Anesthesia Type: General Level of consciousness: awake Pain management: pain level controlled Vital Signs Assessment: post-procedure vital signs reviewed and stable Respiratory status: spontaneous breathing and respiratory function stable Cardiovascular status: blood pressure returned to baseline and stable Postop Assessment: no headache and no apparent nausea or vomiting Anesthetic complications: no Comments: Late entry   No notable events documented.   Last Vitals:  Vitals:   09/17/22 1159 09/17/22 1436  BP: (!) 161/81 (!) 106/59  Pulse: 69 (!) 53  Resp: 18 16  Temp: 36.9 C 36.5 C  SpO2: 95% 96%    Last Pain:  Vitals:   09/17/22 1436  TempSrc: Oral  PainSc: 0-No pain                 Louann Sjogren

## 2022-09-23 LAB — SURGICAL PATHOLOGY

## 2022-09-24 ENCOUNTER — Other Ambulatory Visit: Payer: Self-pay | Admitting: *Deleted

## 2022-09-24 ENCOUNTER — Encounter (HOSPITAL_COMMUNITY): Payer: Self-pay | Admitting: Gastroenterology

## 2022-09-24 DIAGNOSIS — K559 Vascular disorder of intestine, unspecified: Secondary | ICD-10-CM

## 2022-09-25 ENCOUNTER — Telehealth (INDEPENDENT_AMBULATORY_CARE_PROVIDER_SITE_OTHER): Payer: Self-pay | Admitting: *Deleted

## 2022-09-25 NOTE — Telephone Encounter (Signed)
Spoke with sister. She has been provided with CS # to reschedule

## 2022-09-25 NOTE — Telephone Encounter (Signed)
Patient left voicmail that she wanted to reschedule ct scan that is scheduled for jan 19th.   [phone]

## 2022-09-26 ENCOUNTER — Ambulatory Visit (HOSPITAL_COMMUNITY): Payer: Medicare Other

## 2022-10-09 ENCOUNTER — Encounter (INDEPENDENT_AMBULATORY_CARE_PROVIDER_SITE_OTHER): Payer: Self-pay | Admitting: Gastroenterology

## 2022-10-09 ENCOUNTER — Ambulatory Visit (INDEPENDENT_AMBULATORY_CARE_PROVIDER_SITE_OTHER): Payer: Medicare Other | Admitting: Gastroenterology

## 2022-10-09 VITALS — BP 117/62 | HR 70 | Temp 97.5°F | Ht 64.0 in | Wt 167.8 lb

## 2022-10-09 DIAGNOSIS — K625 Hemorrhage of anus and rectum: Secondary | ICD-10-CM | POA: Diagnosis not present

## 2022-10-09 NOTE — Progress Notes (Addendum)
Referring Provider: Lanelle Bal, PA-C Primary Care Physician:  Lanelle Bal, PA-C Primary GI Physician: Jenetta Downer   Chief Complaint  Patient presents with   Rectal Bleeding    Patient arrives with sister [name]. Follow up on rectal bleeding. Reports she stopped taking miralax because it gave her diarrhea and she has seen blood in stool one time recently.    HPI:   Cynthia Schwartz is a 62 y.o. female with past medical history of asthma, cognitive impairment   Patient presenting today for follow up of constipation and rectal bleeding  Last seen October 2023, at that time, having rectal bleeding, unable to provide much history, thought it was occurring maybe weekly, some occasional abdominal pain, 2-3Bms per day.   Recommended colonoscopy, ample water intake, high fiber diet  Colonoscopy as outlined below, one TA, path suggestive of ischemia in colon, recommended to proceed with CTA A/P which is scheduled 3/1.   Present: Patient here with sister linda who is caregiver and helps provide some history due to patient's cognitive impairment. She notes one episode of toilet tissue hematochezia since her colonoscopy. She denies any abdominal pain. She has had some diarrhea when taking the miralax. She even had some fecal incontinence with this so her sister stopped giving this to her. Patient denies needing to strain or any pain with defecation. States she uses the bathroom when she feels the urge without any issue. Appetite is good. Her sister reports she eats very well. Notably, weight is down to 167 lbs from 180 in October, she denies in activity or diet. Sister feels that patient's clothes are actually getting tighter so she is surprised there has been any weight loss.  Patient denies melena, nausea, vomiting, diarrhea, constipation, dysphagia, odyonophagia, early satiety.   Last Colonoscopy: - 09/2022 The examined portion of the ileum was normal.                          normal - Exam  performed to rule out Crohn's disease. Inflammation was found in the transverse colon, in   the ascending colon and at the cecum., new compared to previous examinations. Biopsied-ischemia  - One 4 mm polyp in the ascending colon-TA - Non-bleeding internal hemorrhoids. Last Endoscopy: never   Recommendations:  Repeat TCS in 5 years   Past Medical History:  Diagnosis Date   Asthma     Past Surgical History:  Procedure Laterality Date   BIOPSY  09/17/2022   Procedure: BIOPSY;  Surgeon: Harvel Quale, MD;  Location: [facility];  Service: Gastroenterology;;   COLONOSCOPY WITH PROPOFOL N/A 09/17/2022   Procedure: COLONOSCOPY WITH PROPOFOL;  Surgeon: Harvel Quale, MD;  Location: [facility];  Service: Gastroenterology;  Laterality: N/A;  1100 ASA 2   ovarian cyst removed     POLYPECTOMY  09/17/2022   Procedure: POLYPECTOMY;  Surgeon: Montez Morita, Quillian Quince, MD;  Location: [facility];  Service: Gastroenterology;;   spot on kidney      Current Outpatient Medications  Medication Sig Dispense Refill   albuterol (PROVENTIL) (2.5 MG/3ML) 0.083% nebulizer solution Take 2.5 mg by nebulization every 6 (six) hours as needed for wheezing or shortness of breath.     famotidine (PEPCID) 20 MG tablet Take 20 mg by mouth daily.     FLUoxetine (PROZAC) 20 MG capsule Take 20 mg by mouth daily.     montelukast (SINGULAIR) 10 MG tablet Take 10 mg by mouth at bedtime.  omeprazole (PRILOSEC) 20 MG capsule Take 20 mg by mouth daily.     No current facility-administered medications for this visit.    Allergies as of 10/09/2022   (No Known Allergies)    No family history on file.  Social History   Socioeconomic History   Marital status: Single    Spouse name: Not on file   Number of children: Not on file   Years of education: Not on file   Highest education level: Not on file  Occupational History   Not on file  Tobacco Use   Smoking status: Never     Passive exposure: Never   Smokeless tobacco: Never  Substance and Sexual Activity   Alcohol use: No   Drug use: Not on file   Sexual activity: Not on file  Other Topics Concern   Not on file  Social History Narrative   Not on file   Social Determinants of Health   Financial Resource Strain: Not on file  Food Insecurity: Not on file  Transportation Needs: Not on file  Physical Activity: Not on file  Stress: Not on file  Social Connections: Not on file    Review of systems General: negative for malaise, night sweats, fever, chills, weight loss Neck: Negative for lumps, goiter, pain and significant neck swelling Resp: Negative for cough, wheezing, dyspnea at rest CV: Negative for chest pain, leg swelling, palpitations, orthopnea GI: denies melena, nausea, vomiting, diarrhea, constipation, dysphagia, odyonophagia, early satiety or unintentional weight loss. +toilet tissue hematochezia  MSK: Negative for joint pain or swelling, back pain, and muscle pain. Derm: Negative for itching or rash Psych: Denies depression, anxiety, memory loss, confusion. No homicidal or suicidal ideation.  Heme: Negative for prolonged bleeding, bruising easily, and swollen nodes. Endocrine: Negative for cold or heat intolerance, polyuria, polydipsia and goiter. Neuro: negative for tremor, gait imbalance, syncope and seizures. The remainder of the review of systems is noncontributory.  Physical Exam: BP 117/62 (BP Location: Left Arm, Patient Position: Sitting, Cuff Size: Large)   Pulse 70   Temp (!) 97.5 F (36.4 C) (Oral)   Ht '5\' 4"'$  (1.626 m)   Wt 167 lb 12.8 oz (76.1 kg)   BMI 28.80 kg/m  General:   Alert and oriented. No distress noted. Pleasant and cooperative.  Head:  Normocephalic and atraumatic. Eyes:  Conjuctiva clear without scleral icterus. Mouth:  Oral mucosa pink and moist. Good dentition. No lesions. Heart: Normal rate and rhythm, s1 and s2 heart sounds present.  Lungs: Clear lung  sounds in all lobes. Respirations equal and unlabored. Abdomen:  +BS, soft, non-tender and non-distended. No rebound or guarding. No HSM or masses noted. Derm: No palmar erythema or jaundice Msk:  Symmetrical without gross deformities. Normal posture. Extremities:  Without edema. Neurologic:  Alert and  oriented x4 Psych:  Alert and cooperative. Normal mood and affect.  Invalid input(s): "6 MONTHS"   ASSESSMENT: Cynthia Schwartz is a 62 y.o. female presenting today for follow up of rectal bleeding.  Colonoscopy earlier this month with inflammation was found in the transverse colon, in  the ascending colon and at the cecum, new compared to previous examinations, biopsy concerning for ischemia. She is set up for CTA A/p on march 1st. She has had one episode of toilet tissue hematochezia since colonoscopy. Denies abdominal pain, constipation or changes in appetite. Certainly concern for ischemia given continued rectal bleeding and weight loss. Encouraged to keep CTA appt on March 1st for further evaluation. She will make me  aware of heavier bleeding or any associated abdominal pain.    PLAN:  Keep scheduled CTA in march  2. Increase water intake to 64oz per day 3. Avoid straining and limit toilet time to <5 minutes 4. Patient to make me aware of heavier rectal bleeding or associated abdominal pain  All questions were answered, patient verbalized understanding and is in agreement with plan as outlined above.    Follow Up: 3 months   Ashlyn Cabler L. Alver Sorrow, MSN, APRN, AGNP-C Adult-Gerontology Nurse Practitioner [facility]   I have reviewed the note and agree with the APP's assessment as described in this progress note  Will proceed with scheduled CTA in March. If she does not have ischmic changes, may consider adding Lialda to her current regimen.  Maylon Peppers, MD Gastroenterology and Hepatology [facility]

## 2022-10-09 NOTE — Patient Instructions (Signed)
Please increase water intake to 64 oz per day Avoid straining on the toilet and try to avoid sitting on the toilet for longer than 5 minutes Please make me aware if you have heavy rectal bleeding or any associated abdominal pain Please keep CT appt on march 1st at 1 pm at [place]  Follow up 3 months

## 2022-10-16 DIAGNOSIS — R030 Elevated blood-pressure reading, without diagnosis of hypertension: Secondary | ICD-10-CM | POA: Diagnosis not present

## 2022-10-16 DIAGNOSIS — R21 Rash and other nonspecific skin eruption: Secondary | ICD-10-CM | POA: Diagnosis not present

## 2022-10-16 DIAGNOSIS — Z6828 Body mass index (BMI) 28.0-28.9, adult: Secondary | ICD-10-CM | POA: Diagnosis not present

## 2022-10-16 DIAGNOSIS — R059 Cough, unspecified: Secondary | ICD-10-CM | POA: Diagnosis not present

## 2022-11-07 ENCOUNTER — Ambulatory Visit (HOSPITAL_COMMUNITY): Payer: Medicare Other

## 2022-11-12 ENCOUNTER — Telehealth: Payer: Self-pay | Admitting: *Deleted

## 2022-11-12 NOTE — Telephone Encounter (Signed)
Pt's sister [name] says pt had a CT scheduled for 11/07/22 and she was on her way to the hospital to take her. [name] started having stomach issues and went on herself and had to turn around. She called the hospital to let them know what happened. They cancelled the procedure instead of rescheduling it. She tried to call back to reschedule it but it was closed. Forestine Na is booking out into May. Is it ok to schedule her for May? Please advise.Thank you

## 2022-11-13 ENCOUNTER — Other Ambulatory Visit: Payer: Self-pay | Admitting: *Deleted

## 2022-11-13 NOTE — Telephone Encounter (Signed)
Pt's sister says she doesn't like to drive to [place], she says she only drives around town and would prefer to stay here.

## 2022-11-13 NOTE — Telephone Encounter (Signed)
Pt's CTA has been rescheduled until 12/17/22, arrive at 3:15 pm to check in.

## 2022-11-13 NOTE — Progress Notes (Signed)
error 

## 2022-12-17 ENCOUNTER — Encounter (HOSPITAL_COMMUNITY): Payer: Self-pay | Admitting: Radiology

## 2022-12-17 ENCOUNTER — Ambulatory Visit (HOSPITAL_COMMUNITY)
Admission: RE | Admit: 2022-12-17 | Discharge: 2022-12-17 | Disposition: A | Discharge status: Home / Self Care | Payer: Medicare Other | Source: Ambulatory Visit | Attending: Gastroenterology | Admitting: Gastroenterology

## 2022-12-17 DIAGNOSIS — K559 Vascular disorder of intestine, unspecified: Secondary | ICD-10-CM | POA: Diagnosis not present

## 2022-12-17 DIAGNOSIS — K7689 Other specified diseases of liver: Secondary | ICD-10-CM | POA: Diagnosis not present

## 2022-12-17 MED ORDER — IOHEXOL 350 MG/ML SOLN
100.0000 mL | Freq: Once | INTRAVENOUS | Status: AC | PRN
  Administered 2022-12-17: 100 mL via INTRAVENOUS

## 2022-12-19 LAB — POCT I-STAT CREATININE: Creatinine, Ser: 1 mg/dL (ref 0.44–1.00)

## 2022-12-23 NOTE — Progress Notes (Signed)
1 yr CT noted in recall report faxed to PCP

## 2022-12-30 DIAGNOSIS — J4541 Moderate persistent asthma with (acute) exacerbation: Secondary | ICD-10-CM | POA: Diagnosis not present

## 2022-12-30 DIAGNOSIS — R062 Wheezing: Secondary | ICD-10-CM | POA: Diagnosis not present

## 2022-12-30 DIAGNOSIS — Z6827 Body mass index (BMI) 27.0-27.9, adult: Secondary | ICD-10-CM | POA: Diagnosis not present

## 2022-12-30 DIAGNOSIS — R030 Elevated blood-pressure reading, without diagnosis of hypertension: Secondary | ICD-10-CM | POA: Diagnosis not present

## 2022-12-30 DIAGNOSIS — J309 Allergic rhinitis, unspecified: Secondary | ICD-10-CM | POA: Diagnosis not present

## 2023-01-13 ENCOUNTER — Encounter (INDEPENDENT_AMBULATORY_CARE_PROVIDER_SITE_OTHER): Payer: Self-pay | Admitting: Gastroenterology

## 2023-01-13 ENCOUNTER — Ambulatory Visit (INDEPENDENT_AMBULATORY_CARE_PROVIDER_SITE_OTHER): Payer: Medicare Other | Admitting: Gastroenterology

## 2023-02-10 DIAGNOSIS — Z1231 Encounter for screening mammogram for malignant neoplasm of breast: Secondary | ICD-10-CM | POA: Diagnosis not present

## 2023-05-01 DIAGNOSIS — Z6825 Body mass index (BMI) 25.0-25.9, adult: Secondary | ICD-10-CM | POA: Diagnosis not present

## 2023-05-01 DIAGNOSIS — R030 Elevated blood-pressure reading, without diagnosis of hypertension: Secondary | ICD-10-CM | POA: Diagnosis not present

## 2023-05-01 DIAGNOSIS — K219 Gastro-esophageal reflux disease without esophagitis: Secondary | ICD-10-CM | POA: Diagnosis not present

## 2023-05-01 DIAGNOSIS — R739 Hyperglycemia, unspecified: Secondary | ICD-10-CM | POA: Diagnosis not present

## 2023-05-01 DIAGNOSIS — R062 Wheezing: Secondary | ICD-10-CM | POA: Diagnosis not present

## 2023-05-01 DIAGNOSIS — J4541 Moderate persistent asthma with (acute) exacerbation: Secondary | ICD-10-CM | POA: Diagnosis not present

## 2023-05-12 DIAGNOSIS — Z6825 Body mass index (BMI) 25.0-25.9, adult: Secondary | ICD-10-CM | POA: Diagnosis not present

## 2023-05-12 DIAGNOSIS — R030 Elevated blood-pressure reading, without diagnosis of hypertension: Secondary | ICD-10-CM | POA: Diagnosis not present

## 2023-05-12 DIAGNOSIS — Z23 Encounter for immunization: Secondary | ICD-10-CM | POA: Diagnosis not present

## 2023-05-12 DIAGNOSIS — J45901 Unspecified asthma with (acute) exacerbation: Secondary | ICD-10-CM | POA: Diagnosis not present

## 2023-05-12 DIAGNOSIS — N61 Mastitis without abscess: Secondary | ICD-10-CM | POA: Diagnosis not present

## 2023-05-19 DIAGNOSIS — J45901 Unspecified asthma with (acute) exacerbation: Secondary | ICD-10-CM | POA: Diagnosis not present

## 2023-05-19 DIAGNOSIS — Z6825 Body mass index (BMI) 25.0-25.9, adult: Secondary | ICD-10-CM | POA: Diagnosis not present

## 2023-05-19 DIAGNOSIS — N61 Mastitis without abscess: Secondary | ICD-10-CM | POA: Diagnosis not present

## 2023-10-15 DIAGNOSIS — Z2089 Contact with and (suspected) exposure to other communicable diseases: Secondary | ICD-10-CM | POA: Diagnosis not present

## 2023-10-15 DIAGNOSIS — J09X2 Influenza due to identified novel influenza A virus with other respiratory manifestations: Secondary | ICD-10-CM | POA: Diagnosis not present

## 2023-10-15 DIAGNOSIS — Z6824 Body mass index (BMI) 24.0-24.9, adult: Secondary | ICD-10-CM | POA: Diagnosis not present

## 2023-11-27 ENCOUNTER — Encounter (INDEPENDENT_AMBULATORY_CARE_PROVIDER_SITE_OTHER): Payer: Self-pay | Admitting: *Deleted

## 2023-12-02 DIAGNOSIS — Z6824 Body mass index (BMI) 24.0-24.9, adult: Secondary | ICD-10-CM | POA: Diagnosis not present

## 2023-12-02 DIAGNOSIS — J4520 Mild intermittent asthma, uncomplicated: Secondary | ICD-10-CM | POA: Diagnosis not present

## 2023-12-02 DIAGNOSIS — K219 Gastro-esophageal reflux disease without esophagitis: Secondary | ICD-10-CM | POA: Diagnosis not present

## 2023-12-02 DIAGNOSIS — Z1329 Encounter for screening for other suspected endocrine disorder: Secondary | ICD-10-CM | POA: Diagnosis not present

## 2023-12-02 DIAGNOSIS — Z0001 Encounter for general adult medical examination with abnormal findings: Secondary | ICD-10-CM | POA: Diagnosis not present

## 2023-12-02 DIAGNOSIS — Z1389 Encounter for screening for other disorder: Secondary | ICD-10-CM | POA: Diagnosis not present

## 2023-12-02 DIAGNOSIS — R21 Rash and other nonspecific skin eruption: Secondary | ICD-10-CM | POA: Diagnosis not present

## 2023-12-02 DIAGNOSIS — Z1331 Encounter for screening for depression: Secondary | ICD-10-CM | POA: Diagnosis not present

## 2023-12-02 DIAGNOSIS — Z13220 Encounter for screening for lipoid disorders: Secondary | ICD-10-CM | POA: Diagnosis not present

## 2023-12-02 DIAGNOSIS — F819 Developmental disorder of scholastic skills, unspecified: Secondary | ICD-10-CM | POA: Diagnosis not present

## 2024-06-24 DIAGNOSIS — R062 Wheezing: Secondary | ICD-10-CM | POA: Diagnosis not present

## 2024-06-24 DIAGNOSIS — J45901 Unspecified asthma with (acute) exacerbation: Secondary | ICD-10-CM | POA: Diagnosis not present

## 2024-06-24 DIAGNOSIS — Z6823 Body mass index (BMI) 23.0-23.9, adult: Secondary | ICD-10-CM | POA: Diagnosis not present

## 2024-07-13 ENCOUNTER — Encounter (INDEPENDENT_AMBULATORY_CARE_PROVIDER_SITE_OTHER): Payer: Self-pay | Admitting: Gastroenterology
